# Patient Record
Sex: Female | Born: 1935 | Race: White | Hispanic: No | Marital: Married | Smoking: Never smoker
Health system: Southern US, Community
[De-identification: ages and names within clinical notes are randomized; demographics above are authoritative.]

## PROBLEM LIST (undated history)

## (undated) DIAGNOSIS — K219 Gastro-esophageal reflux disease without esophagitis: Secondary | ICD-10-CM

## (undated) DIAGNOSIS — I10 Essential (primary) hypertension: Secondary | ICD-10-CM

## (undated) DIAGNOSIS — M810 Age-related osteoporosis without current pathological fracture: Secondary | ICD-10-CM

## (undated) DIAGNOSIS — R413 Other amnesia: Secondary | ICD-10-CM

## (undated) DIAGNOSIS — E0590 Thyrotoxicosis, unspecified without thyrotoxic crisis or storm: Secondary | ICD-10-CM

## (undated) HISTORY — PX: ABDOMINAL HYSTERECTOMY: SHX81

## (undated) HISTORY — DX: Other amnesia: R41.3

## (undated) HISTORY — PX: TONSILLECTOMY: SUR1361

## (undated) HISTORY — PX: BACK SURGERY: SHX140

---

## 1997-05-18 ENCOUNTER — Ambulatory Visit (HOSPITAL_COMMUNITY): Admission: RE | Admit: 1997-05-18 | Discharge: 1997-05-18 | Payer: Self-pay | Admitting: Obstetrics and Gynecology

## 1997-05-23 ENCOUNTER — Other Ambulatory Visit: Admission: RE | Admit: 1997-05-23 | Discharge: 1997-05-23 | Payer: Self-pay | Admitting: Obstetrics and Gynecology

## 1997-12-06 ENCOUNTER — Emergency Department (HOSPITAL_COMMUNITY): Admission: EM | Admit: 1997-12-06 | Discharge: 1997-12-06 | Payer: Self-pay | Admitting: Emergency Medicine

## 1999-02-05 ENCOUNTER — Encounter: Admission: RE | Admit: 1999-02-05 | Discharge: 1999-02-05 | Payer: Self-pay | Admitting: *Deleted

## 1999-03-19 ENCOUNTER — Encounter: Payer: Self-pay | Admitting: Obstetrics and Gynecology

## 1999-03-19 ENCOUNTER — Encounter: Admission: RE | Admit: 1999-03-19 | Discharge: 1999-03-19 | Payer: Self-pay | Admitting: Obstetrics and Gynecology

## 1999-07-10 ENCOUNTER — Encounter: Admission: RE | Admit: 1999-07-10 | Discharge: 1999-07-10 | Payer: Self-pay | Admitting: *Deleted

## 1999-10-16 ENCOUNTER — Encounter: Admission: RE | Admit: 1999-10-16 | Discharge: 1999-10-16 | Payer: Self-pay | Admitting: *Deleted

## 2000-03-24 ENCOUNTER — Encounter: Admission: RE | Admit: 2000-03-24 | Discharge: 2000-03-24 | Payer: Self-pay | Admitting: Obstetrics and Gynecology

## 2000-03-24 ENCOUNTER — Encounter: Payer: Self-pay | Admitting: Obstetrics and Gynecology

## 2000-09-24 ENCOUNTER — Encounter: Payer: Self-pay | Admitting: Surgery

## 2000-09-26 ENCOUNTER — Ambulatory Visit (HOSPITAL_COMMUNITY): Admission: RE | Admit: 2000-09-26 | Discharge: 2000-09-26 | Payer: Self-pay | Admitting: Surgery

## 2001-02-13 ENCOUNTER — Other Ambulatory Visit: Admission: RE | Admit: 2001-02-13 | Discharge: 2001-02-13 | Payer: Self-pay | Admitting: Obstetrics and Gynecology

## 2001-02-16 ENCOUNTER — Encounter: Admission: RE | Admit: 2001-02-16 | Discharge: 2001-02-16 | Payer: Self-pay | Admitting: Obstetrics and Gynecology

## 2001-02-16 ENCOUNTER — Encounter: Payer: Self-pay | Admitting: Obstetrics and Gynecology

## 2001-03-26 ENCOUNTER — Encounter: Admission: RE | Admit: 2001-03-26 | Discharge: 2001-03-26 | Payer: Self-pay | Admitting: Obstetrics and Gynecology

## 2001-03-26 ENCOUNTER — Encounter: Payer: Self-pay | Admitting: Obstetrics and Gynecology

## 2002-03-29 ENCOUNTER — Encounter: Admission: RE | Admit: 2002-03-29 | Discharge: 2002-03-29 | Payer: Self-pay | Admitting: Obstetrics and Gynecology

## 2002-03-29 ENCOUNTER — Encounter: Payer: Self-pay | Admitting: Obstetrics and Gynecology

## 2002-12-30 ENCOUNTER — Encounter: Admission: RE | Admit: 2002-12-30 | Discharge: 2002-12-30 | Payer: Self-pay | Admitting: Internal Medicine

## 2003-02-21 ENCOUNTER — Encounter: Admission: RE | Admit: 2003-02-21 | Discharge: 2003-02-21 | Payer: Self-pay | Admitting: Orthopedic Surgery

## 2003-03-16 ENCOUNTER — Inpatient Hospital Stay (HOSPITAL_COMMUNITY): Admission: RE | Admit: 2003-03-16 | Discharge: 2003-03-17 | Payer: Self-pay | Admitting: Neurosurgery

## 2003-06-17 ENCOUNTER — Encounter: Admission: RE | Admit: 2003-06-17 | Discharge: 2003-06-17 | Payer: Self-pay | Admitting: Obstetrics and Gynecology

## 2003-10-05 ENCOUNTER — Encounter: Admission: RE | Admit: 2003-10-05 | Discharge: 2003-10-05 | Payer: Self-pay | Admitting: Obstetrics and Gynecology

## 2004-05-02 ENCOUNTER — Emergency Department (HOSPITAL_COMMUNITY): Admission: EM | Admit: 2004-05-02 | Discharge: 2004-05-02 | Payer: Self-pay | Admitting: Emergency Medicine

## 2004-08-22 ENCOUNTER — Encounter: Admission: RE | Admit: 2004-08-22 | Discharge: 2004-08-22 | Payer: Self-pay | Admitting: Obstetrics and Gynecology

## 2005-07-27 ENCOUNTER — Emergency Department (HOSPITAL_COMMUNITY): Admission: EM | Admit: 2005-07-27 | Discharge: 2005-07-27 | Payer: Self-pay | Admitting: Emergency Medicine

## 2005-09-10 ENCOUNTER — Ambulatory Visit: Payer: Self-pay | Admitting: Gastroenterology

## 2005-09-11 ENCOUNTER — Ambulatory Visit: Payer: Self-pay | Admitting: Gastroenterology

## 2005-09-30 ENCOUNTER — Encounter: Admission: RE | Admit: 2005-09-30 | Discharge: 2005-09-30 | Payer: Self-pay | Admitting: Obstetrics and Gynecology

## 2006-09-15 ENCOUNTER — Emergency Department (HOSPITAL_COMMUNITY): Admission: EM | Admit: 2006-09-15 | Discharge: 2006-09-15 | Payer: Self-pay | Admitting: Emergency Medicine

## 2006-10-06 ENCOUNTER — Encounter: Admission: RE | Admit: 2006-10-06 | Discharge: 2006-10-06 | Payer: Self-pay | Admitting: Obstetrics and Gynecology

## 2007-06-05 ENCOUNTER — Encounter: Admission: RE | Admit: 2007-06-05 | Discharge: 2007-06-05 | Payer: Self-pay | Admitting: Internal Medicine

## 2007-10-07 ENCOUNTER — Encounter: Admission: RE | Admit: 2007-10-07 | Discharge: 2007-10-07 | Payer: Self-pay | Admitting: Obstetrics and Gynecology

## 2008-10-07 ENCOUNTER — Encounter: Admission: RE | Admit: 2008-10-07 | Discharge: 2008-10-07 | Payer: Self-pay | Admitting: Obstetrics and Gynecology

## 2009-08-01 ENCOUNTER — Encounter: Admission: RE | Admit: 2009-08-01 | Discharge: 2009-08-01 | Payer: Self-pay | Admitting: Internal Medicine

## 2009-10-09 ENCOUNTER — Encounter: Admission: RE | Admit: 2009-10-09 | Discharge: 2009-10-09 | Payer: Self-pay | Admitting: Internal Medicine

## 2010-03-10 ENCOUNTER — Encounter: Payer: Self-pay | Admitting: Internal Medicine

## 2010-07-06 NOTE — Op Note (Signed)
[facility]  Patient:    Pamela Ibarra, Pamela Ibarra                      MRN: [id number] Proc. Date: 09/26/00 Adm. Date:  [id number] Attending:  Abigail Miyamoto A                           Operative Report  PREOPERATIVE DIAGNOSIS:  Hemorrhoids.  POSTOPERATIVE DIAGNOSIS:  Hemorrhoids.  PROCEDURE:  Hemorrhoidectomy.  SURGEON:  Abigail Miyamoto, M.D.  ANESTHESIA:  General endotracheal and 0.25% Marcaine with epinephrine.  ESTIMATED BLOOD LOSS:  Minimal.  DESCRIPTION OF PROCEDURE:  The patient was brought to the operating room and identified as [employer].  She was placed supine on the operating room table, and general anesthesia was induced.  Next, the patient was placed in the lithotomy position.  The perineum was then examined.  The Hill-Ferguson retractor was inserted into the perineum.  The patient was found to have one thrombosed external hemorrhoid which was incised with the clot being removed with a scalpel and a hemostat.  Next, the patients large hemorrhoidal column on the opposite side was identified and grasped with a Kelly clamp.  A stitch was then placed in the most internal aspect of the hemorrhoidal column.  This stitch was a 2-0 chromic suture.  The entire hemorrhoidal column was then excised with the electrocautery.  The defect in the mucosa was then closed with a running interlock 2-0 chromic suture.  Hemostasis appeared to be achieved.  The entire perineum was then anesthetized with 0.25% Marcaine with epinephrine.  A piece of Gelfoam was then inserted into the anal canal.  The patient tolerated the procedure well.  All sponge, needle, and instrument counts were correct at the end of the procedure.  The patient was then extubated in the operating room and taken in stable condition to the recovery room. DD:  09/26/00 TD:  09/27/00 Job: [id number]

## 2010-07-06 NOTE — Assessment & Plan Note (Signed)
[facility]                           GASTROENTEROLOGY OFFICE NOTE   NAME:YOUNGERShaneika, Rossa                      MRN:          [id number]  DATE:09/10/2005                            DOB:          09/13/1928    GI CONSULT:  Ms. Niese is a 77 year old white female referred by Dr.  Wylene Simmer for evaluation of epigastric abdominal pain.   Ms. Benham has had years of acid reflux treated with prescription Pepcid.  Apparently over the last year or two her problems with acid reflux went away  only to return over the last year with rather typical burning, some sternal  pain and subxiphoid pain radiating to her back without associated dysphagia.  She has been on and off Aciphex and Prevacid 1-2 times a day with mild  improvement but continues to have problems.  She presented to the emergency  room apparently on July 27, 2005, with severe pain and had a negative  ultrasound exam.  I do not have records of that visit this time.  She denies  any specific episode biliary complaints such as clay colored stools, dark  urine, icterus, fever or chills.  She denies any dysphagia, and her weight  is stable.  She has regular bowel movements without melena or hematochezia  and relates her recent hemoccult cards in November were negative.  I have  previously seen this patient and done colonoscopy in July, 2002, because of  recurrent rectal bleeding, and she had hemorrhoidectomy by Dr. Abigail Miyamoto.   All of the patient's upper GI problems seem to have exacerbated since she  has been placed on Actonel 35 mg once a week.   OTHER MEDICATIONS:  1.  Synthroid 150 mcg a day.  2.  Ziac 2.5-6.25 mg a day.  3.  Amlodipine 5 mg a day.  4.  Colace daily.  5.  Multivitamins daily.  6.  Viactiv 500 daily.  7.  Prevacid 30 mg a day.  8.  She uses p.r.n. Tylenol, Valium, and Dulcolax.   SHE DENIES DRUG ALLERGIES.   FAMILY HISTORY:  Noncontributory.   PAST MEDICAL  HISTORY:  1.  The patient does suffer from essential hypertension, chronic thyroid      dysfunction.  2.  She has had a total abdominal hysterectomy and bilateral oophorectomy in      1999.  3.  As mentioned above, she had a hemorrhoidectomy by Dr. Magnus Ivan.   SOCIAL HISTORY:  The patient is married and lives with her husband.  She is  a retired Engineer, civil (consulting) from [employer].  She does not smoke or use  ethanol.   REVIEW OF SYSTEMS:  Otherwise noncontributory.  She has no symptoms of  Raynaud's phenomenon or other symptoms of collagen vascular disease.  She  denies any current cardiovascular, pulmonary, neurological, genitourinary,  or psychiatric problems.  She does have osteopenia for which she takes  Actonel.   EXAM:  Shows her to be a healthy appearing white female who appears stated  age.  She is 5 feet 5 inches tall and weighs 130 pounds.  Blood pressure is  120/74 and pulse was 78 and regular.  I could not appreciate a stigmata of  chronic liver disease or thyromegaly.  CHEST:  Clear to percussion and auscultation.  I could not appreciate  murmurs, gallops or rubs and she appeared to be in a regular rhythm.  ABDOMEN:  Showed no organomegaly, masses or tenderness.  There is a well  healed suprapubic scar noted.  __________ EXTREMITIES:  Unremarkable.  RECTAL EXAM:  Deferred at this time.   ASSESSMENT:  1.  Acid reflux with probable hiatal hernia.  Her pain is somewhat unusual      and atypical because of the severity of the pain and recurrent nature of      the pain.  I suspect this is related to Actonel use.  2.  History of constipation, predominantly irritable bowel syndrome and      hemorrhoidal bleeding with a previous hemorrhoidectomy.  3.  Status post total abdominal hysterectomy and bilateral oophorectomy.  4.  History of chronic thyroid dysfunction.  5.  History of well controlled essential hypertension.   RECOMMENDATIONS:  1.  Outpatient endoscopic exam at her  convenience.  2.  Restart Prevacid 30 mg twice a day 30 minutes before breakfast and      supper.  3.  Movie on acid reflux and its management.  4.  Levsin use p.r.n.  5.  Other medications as per Dr. Wylene Simmer.                                   Vania Rea. Jarold Motto, MD, Clementeen Graham, [place]   DRP/MedQ  DD:  09/10/2005  DT:  09/10/2005  Job #:  [id number]   cc:   Gaspar Garbe, MD  Abigail Miyamoto, MD

## 2010-07-06 NOTE — Op Note (Signed)
Pamela Ibarra, Pamela Ibarra                         ACCOUNT NO.:  [id number]   MEDICAL RECORD NO.:  [id number]                   PATIENT TYPE:  INP   LOCATION:  2899                                 FACILITY:  [facility]   PHYSICIAN:  Hewitt Shorts, M.D.            DATE OF BIRTH:  1928/12/04   DATE OF PROCEDURE:  03/16/2003  DATE OF DISCHARGE:                                 OPERATIVE REPORT   PREOPERATIVE DIAGNOSIS:  Bilateral L4-L5 synovial cyst, left larger than  right, lumbar spinal stenosis, lumbar degenerative disc disease, and lumbar  radiculopathy.   POSTOPERATIVE DIAGNOSIS:  Bilateral L4-L5 synovial cyst, left larger than  right, lumbar spinal stenosis, lumbar degenerative disc disease, and lumbar  radiculopathy.   PROCEDURE:  Bilateral L4-L5 lumbar laminotomies and resection of bilateral  synovial cyst.   SURGEON:  Hewitt Shorts, M.D.   ASSISTANT:  Payton Doughty, M.D.   ANESTHESIA:  General endotracheal anesthesia.   INDICATIONS FOR PROCEDURE:  This 77 year old woman who presented with lumbar  radiculopathy was found by MRI scan to have bilateral L4-L5 synovial cyst  with the left side much larger than the right side.  There was evidence of  facet arthropathy, however, flexion extension MR spine x-rays showed minimal  listhesis L4 and L5 that did not change through flexion extension indicating  a static dynamic listhesis.  The decision was made to proceed with bilateral  laminotomies and resection of the synovial cysts with microdissection.   PROCEDURE:  The patient was brought to the operating room and placed under  general endotracheal anesthesia.  The patient was turned to the prone  position and the lumbar region was prepped with DuraPrep and draped in a  sterile fashion.  The midline was infiltrated with local anesthetic with  epinephrine.  An x-ray was taken, the L4-L5 level was identified.  A midline  incision was made over the L4-L5 level and carried down  through the  subcutaneous tissue, bipolar cautery and electrocautery was used to maintain  hemostasis.  Dissection was carried down to the lumbar fascia which was  incised bilaterally and the paraspinal muscles were dissected from the  spinous process and lamina in a subperiosteal fashion.  Self-retaining  retractor was placed and an x-ray taken and the L4-L5 interlaminar space was  identified.  Bilateral laminotomies were performed using the [facility] Max drill  and Kerrison punches.  The microscope was draped and brought into the field  to provide identification, illumination, and visualization, and the  remainder of the decompression was performed using microdissection and  microsurgical technique.  We carefully removed the ligamentum flavum and the  small right L4-L5 synovial cyst was identified and easily removed.  However,  on the left side, the synovial cyst was much larger, it had numerous  adhesions to the thecal sac, these were carefully dissected using  microdissection and microsurgical technique.  However, portions of the cyst  wall and  ligamentum flavum were stuck down to the dura and, therefore, the  cyst was removed in a piecemeal fashion.  The cyst was entered.  There was a  hemorrhagic portion within it, this was evacuated.  There was also some  xanthomatous material that was, likewise, removed.  We then carefully  removed the cyst wall, although as noted above, there were certain spots  where it was densely adherent to the dura and it was felt that to remove  these would almost certainly result in disruption of the thecal sac.  However, we were able to achieve good decompression of the thecal sac and  nerve roots and it was felt that good decompression had been achieved  bilaterally.  The wound was irrigated on numerous occasions throughout the  case with Bacitracin solution.  Hemostasis was established and confirmed.  Once the decompression was completed and hemostasis  established, we  proceeded with closure.  The deep fascia was closed with interrupted undyed  #1 Vicryl suture, the subcutaneous and subcuticular layer was closed with  interrupted inverted 2-0 undyed Vicryl sutures and 3-0 undyed Vicryl  sutures, and the skin was reapproximated with Dermabond.  The patient  tolerated the procedure well.  Estimated blood loss was 50 mL.  Sponge  counts were correct.  Following surgery, the patient was turned back to the  supine position, reversed from the anesthetic, extubated, and transferred to  the recovery room for further care.                                               Hewitt Shorts, M.D.    RWN/MEDQ  D:  03/16/2003  T:  03/16/2003  Job:  [id number]

## 2010-09-14 ENCOUNTER — Other Ambulatory Visit: Payer: Self-pay | Admitting: Internal Medicine

## 2010-09-14 DIAGNOSIS — Z1231 Encounter for screening mammogram for malignant neoplasm of breast: Secondary | ICD-10-CM

## 2010-09-14 DIAGNOSIS — M8580 Other specified disorders of bone density and structure, unspecified site: Secondary | ICD-10-CM

## 2010-10-17 ENCOUNTER — Ambulatory Visit
Admission: RE | Admit: 2010-10-17 | Discharge: 2010-10-17 | Disposition: A | Discharge status: Home / Self Care | Payer: Private Health Insurance - Indemnity | Source: Ambulatory Visit | Attending: Internal Medicine | Admitting: Internal Medicine

## 2010-10-17 DIAGNOSIS — M8580 Other specified disorders of bone density and structure, unspecified site: Secondary | ICD-10-CM

## 2010-10-17 DIAGNOSIS — Z1231 Encounter for screening mammogram for malignant neoplasm of breast: Secondary | ICD-10-CM

## 2010-12-03 LAB — BASIC METABOLIC PANEL
Chloride: 92 — ABNORMAL LOW
GFR calc Af Amer: >60
GFR calc non Af Amer: 57 — ABNORMAL LOW

## 2010-12-03 LAB — CBC
HCT: 43
Hemoglobin: 14.5
Platelets: 322
RBC: 4.67
WBC: 10.5

## 2010-12-03 LAB — POCT CARDIAC MARKERS: Operator id: 294511

## 2011-04-04 DIAGNOSIS — H40019 Open angle with borderline findings, low risk, unspecified eye: Secondary | ICD-10-CM | POA: Diagnosis not present

## 2011-04-04 DIAGNOSIS — H2510 Age-related nuclear cataract, unspecified eye: Secondary | ICD-10-CM | POA: Diagnosis not present

## 2011-05-25 ENCOUNTER — Emergency Department (HOSPITAL_COMMUNITY): Payer: Medicare Other

## 2011-05-25 ENCOUNTER — Encounter (HOSPITAL_COMMUNITY): Payer: Self-pay | Admitting: Nurse Practitioner

## 2011-05-25 ENCOUNTER — Emergency Department (HOSPITAL_COMMUNITY)
Admission: EM | Admit: 2011-05-25 | Discharge: 2011-05-25 | Disposition: A | Discharge status: Home / Self Care | Payer: Medicare Other | Attending: Emergency Medicine | Admitting: Emergency Medicine

## 2011-05-25 ENCOUNTER — Other Ambulatory Visit: Payer: Self-pay

## 2011-05-25 DIAGNOSIS — R079 Chest pain, unspecified: Secondary | ICD-10-CM | POA: Diagnosis not present

## 2011-05-25 DIAGNOSIS — R109 Unspecified abdominal pain: Secondary | ICD-10-CM | POA: Diagnosis not present

## 2011-05-25 DIAGNOSIS — I10 Essential (primary) hypertension: Secondary | ICD-10-CM | POA: Insufficient documentation

## 2011-05-25 DIAGNOSIS — I700 Atherosclerosis of aorta: Secondary | ICD-10-CM | POA: Diagnosis not present

## 2011-05-25 DIAGNOSIS — Z8249 Family history of ischemic heart disease and other diseases of the circulatory system: Secondary | ICD-10-CM | POA: Diagnosis not present

## 2011-05-25 DIAGNOSIS — R1013 Epigastric pain: Secondary | ICD-10-CM | POA: Insufficient documentation

## 2011-05-25 DIAGNOSIS — Z79899 Other long term (current) drug therapy: Secondary | ICD-10-CM | POA: Diagnosis not present

## 2011-05-25 HISTORY — DX: Essential (primary) hypertension: I10

## 2011-05-25 HISTORY — DX: Gastro-esophageal reflux disease without esophagitis: K21.9

## 2011-05-25 LAB — CBC
HCT: 39.8 % (ref 36.0–46.0)
Hemoglobin: 14.1 g/dL (ref 12.0–15.0)
MCH: 31.1 pg (ref 26.0–34.0)
MCHC: 35.4 g/dL (ref 30.0–36.0)
RBC: 4.54 MIL/uL (ref 3.87–5.11)

## 2011-05-25 LAB — POCT I-STAT TROPONIN I
Troponin i, poc: 0 ng/mL (ref 0.00–0.08)
Troponin i, poc: 0.01 ng/mL (ref 0.00–0.08)
Troponin i, poc: 0.01 ng/mL (ref 0.00–0.08)

## 2011-05-25 LAB — COMPREHENSIVE METABOLIC PANEL
Alkaline Phosphatase: 85 U/L (ref 39–117)
BUN: 18 mg/dL (ref 6–23)
CO2: 30 mEq/L (ref 19–32)
GFR calc Af Amer: 74 mL/min — ABNORMAL LOW (ref >90)
GFR calc non Af Amer: 64 mL/min — ABNORMAL LOW (ref >90)
Glucose, Bld: 108 mg/dL — ABNORMAL HIGH (ref 70–99)
Potassium: 4.3 mEq/L (ref 3.5–5.1)
Total Protein: 7.2 g/dL (ref 6.0–8.3)

## 2011-05-25 NOTE — ED Notes (Signed)
C/o "beating in my abdomen and ears" and pain from abdomen into back over past week. Reports she has similar symptoms last year and dr Wylene Simmer tested her for an aneurysm but test was negative. A&Ox4, resp e/u

## 2011-05-25 NOTE — ED Provider Notes (Signed)
The patient has remained pain free for duration of ED visit. Second troponin negative. She can be discharged home to follow up with your doctor this week for recheck.   Rodena Medin, PA-C 05/25/11 2037

## 2011-05-25 NOTE — Discharge Instructions (Signed)
FOLLOW UP WITH YOUR DOCTOR FOR RECHECK THIS WEEK. RECOMMEND RE-STARTING YOUR REFLUX MEDICATION. RETURN HERE WITH ANY WORSENING PAIN OR NEW CONCERNS.  Abdominal Pain Abdominal pain can be caused by many things. Your caregiver decides the seriousness of your pain by an examination and possibly blood tests and X-rays. Many cases can be observed and treated at home. Most abdominal pain is not caused by a disease and will probably improve without treatment. However, in many cases, more time must pass before a clear cause of the pain can be found. Before that point, it may not be known if you need more testing, or if hospitalization or surgery is needed. HOME CARE INSTRUCTIONS   Do not take laxatives unless directed by your caregiver.   Take pain medicine only as directed by your caregiver.   Only take over-the-counter or prescription medicines for pain, discomfort, or fever as directed by your caregiver.   Try a clear liquid diet (broth, tea, or water) for as long as directed by your caregiver. Slowly move to a bland diet as tolerated.  SEEK IMMEDIATE MEDICAL CARE IF:   The pain does not go away.   You have a fever.   You keep throwing up (vomiting).   The pain is felt only in portions of the abdomen. Pain in the right side could possibly be appendicitis. In an adult, pain in the left lower portion of the abdomen could be colitis or diverticulitis.   You pass bloody or black tarry stools.  MAKE SURE YOU:   Understand these instructions.   Will watch your condition.   Will get help right away if you are not doing well or get worse.  Document Released: 11/14/2004 Document Revised: 01/24/2011 Document Reviewed: 09/23/2007 [facility] Patient Information 2012 [place], [place].

## 2011-05-25 NOTE — ED Provider Notes (Signed)
History     CSN: [id number]  Arrival date & time 05/25/11  1337   First MD Initiated Contact with Patient 05/25/11 1559      Chief Complaint  Patient presents with  . Abdominal Pain    (Consider location/radiation/quality/duration/timing/severity/associated sxs/prior treatment) HPI Patient complains of a sensation of her heart beating in her abdomen for the past one or 2 years. Patient also reports epigastric pain radiating to the scapular area intermittent for the past 3-4 weeks sometimes worse with changing position not worse with exertion no associated shortness of breath nausea or sweatiness. Patient presently asymptomatic. No treatment prior to coming here. No lightheadedness no nausea or vomiting last bowel movement yesterday.  Past Medical History  Diagnosis Date  . Hypertension     Past Surgical History  Procedure Date  . Abdominal hysterectomy   . Back surgery     History reviewed. No pertinent family history.  History  Substance Use Topics  . Smoking status: Never Smoker   . Smokeless tobacco: Not on file  . Alcohol Use: No    OB History    Grav Para Term Preterm Abortions TAB SAB Ect Mult Living                  Review of Systems  Constitutional: Negative.   HENT: Negative.   Respiratory: Negative.   Cardiovascular: Negative.   Gastrointestinal: Positive for abdominal pain.  Musculoskeletal: Negative.   Skin: Negative.   Neurological: Negative.   Hematological: Negative.   Psychiatric/Behavioral: Negative.   All other systems reviewed and are negative.    Allergies  Lidocaine and Morphine and related  Home Medications   Current Outpatient Rx  Name Route Sig Dispense Refill  . ACETAMINOPHEN 325 MG PO TABS Oral Take 325-650 mg by mouth every 4 (four) hours as needed. For pain.    Marland Kitchen AMLODIPINE BESYLATE 10 MG PO TABS Oral Take 10 mg by mouth every evening.    Marland Kitchen BISOPROLOL-HYDROCHLOROTHIAZIDE 2.5-6.25 MG PO TABS Oral Take 1 tablet by mouth every  morning.    Marland Kitchen DOCUSATE SODIUM 100 MG PO CAPS Oral Take 100 mg by mouth daily.    Marland Kitchen LEVOTHYROXINE SODIUM 100 MCG PO TABS Oral Take 50 mcg by mouth every morning.    . ADULT MULTIVITAMIN W/MINERALS CH Oral Take 1 tablet by mouth every morning.    Marland Kitchen SIMVASTATIN 20 MG PO TABS Oral Take 20 mg by mouth every evening.      BP 127/72  Pulse 69  Temp(Src) 97.9 F (36.6 C) (Oral)  Resp 20  Ht 5\' 5"  (1.651 m)  Wt 118 lb (53.524 kg)  BMI 19.64 kg/m2  SpO2 98%  Physical Exam  Constitutional: She appears well-developed and well-nourished.  HENT:  Head: Normocephalic and atraumatic.  Eyes: Conjunctivae are normal. Pupils are equal, round, and reactive to light.  Neck: Neck supple. No tracheal deviation present. No thyromegaly present.  Cardiovascular: Normal rate and regular rhythm.   No murmur heard. Pulmonary/Chest: Effort normal and breath sounds normal.  Abdominal: Soft. Bowel sounds are normal. She exhibits no distension and no mass. There is tenderness.       Minimal tenderness at epigastrium   Musculoskeletal: Normal range of motion. She exhibits no edema and no tenderness.  Neurological: She is alert. Coordination normal.  Skin: Skin is warm and dry. No rash noted.  Psychiatric: She has a normal mood and affect.    Date: 05/25/2011  Rate: 70  Rhythm: normal sinus rhythm  QRS Axis: normal  Intervals: normal  ST/T Wave abnormalities: nonspecific T wave changes  Conduction Disutrbances:none  Narrative Interpretation:   Old EKG Reviewed: changes noted PVCs from 09/15/2006 have resolved  ED Course  Procedures (including critical care time)  Labs Reviewed - No data to display No results found.   No diagnosis found.    MDM  Will check ultrasound for abdominal aortic aneurysm, serial cardiac enzymes. Move to CDU. Assessment symptoms highly atypical for ACS         Doug Sou, MD 05/25/11 1643

## 2011-05-25 NOTE — ED Notes (Signed)
Patrient is AOx4 and comfortable with her discharge instructions.

## 2011-05-26 NOTE — ED Provider Notes (Signed)
Medical screening examination/treatment/procedure(s) were performed by non-physician practitioner and as supervising physician I was immediately available for consultation/collaboration.  Abdifatah Colquhoun, MD 05/26/11 0212 

## 2011-05-29 DIAGNOSIS — I10 Essential (primary) hypertension: Secondary | ICD-10-CM | POA: Diagnosis not present

## 2011-05-29 DIAGNOSIS — R0602 Shortness of breath: Secondary | ICD-10-CM | POA: Diagnosis not present

## 2011-05-29 DIAGNOSIS — F411 Generalized anxiety disorder: Secondary | ICD-10-CM | POA: Diagnosis not present

## 2011-07-29 DIAGNOSIS — I10 Essential (primary) hypertension: Secondary | ICD-10-CM | POA: Diagnosis not present

## 2011-07-29 DIAGNOSIS — R82998 Other abnormal findings in urine: Secondary | ICD-10-CM | POA: Diagnosis not present

## 2011-07-29 DIAGNOSIS — R7301 Impaired fasting glucose: Secondary | ICD-10-CM | POA: Diagnosis not present

## 2011-07-29 DIAGNOSIS — E785 Hyperlipidemia, unspecified: Secondary | ICD-10-CM | POA: Diagnosis not present

## 2011-07-29 DIAGNOSIS — E039 Hypothyroidism, unspecified: Secondary | ICD-10-CM | POA: Diagnosis not present

## 2011-07-29 DIAGNOSIS — M949 Disorder of cartilage, unspecified: Secondary | ICD-10-CM | POA: Diagnosis not present

## 2011-07-29 DIAGNOSIS — M899 Disorder of bone, unspecified: Secondary | ICD-10-CM | POA: Diagnosis not present

## 2011-08-05 DIAGNOSIS — E039 Hypothyroidism, unspecified: Secondary | ICD-10-CM | POA: Diagnosis not present

## 2011-08-05 DIAGNOSIS — Z0000 Encounter for general adult medical examination without abnormal findings: Secondary | ICD-10-CM | POA: Diagnosis not present

## 2011-08-05 DIAGNOSIS — I10 Essential (primary) hypertension: Secondary | ICD-10-CM | POA: Diagnosis not present

## 2011-08-05 DIAGNOSIS — E785 Hyperlipidemia, unspecified: Secondary | ICD-10-CM | POA: Diagnosis not present

## 2011-08-06 DIAGNOSIS — Z1212 Encounter for screening for malignant neoplasm of rectum: Secondary | ICD-10-CM | POA: Diagnosis not present

## 2011-11-05 ENCOUNTER — Other Ambulatory Visit: Payer: Self-pay | Admitting: Internal Medicine

## 2011-11-05 DIAGNOSIS — Z1231 Encounter for screening mammogram for malignant neoplasm of breast: Secondary | ICD-10-CM

## 2011-11-18 ENCOUNTER — Ambulatory Visit
Admission: RE | Admit: 2011-11-18 | Discharge: 2011-11-18 | Disposition: A | Discharge status: Home / Self Care | Payer: Medicare Other | Source: Ambulatory Visit | Attending: Internal Medicine | Admitting: Internal Medicine

## 2011-11-18 DIAGNOSIS — Z1231 Encounter for screening mammogram for malignant neoplasm of breast: Secondary | ICD-10-CM | POA: Diagnosis not present

## 2011-11-26 DIAGNOSIS — Z23 Encounter for immunization: Secondary | ICD-10-CM | POA: Diagnosis not present

## 2012-02-05 DIAGNOSIS — I10 Essential (primary) hypertension: Secondary | ICD-10-CM | POA: Diagnosis not present

## 2012-02-05 DIAGNOSIS — K219 Gastro-esophageal reflux disease without esophagitis: Secondary | ICD-10-CM | POA: Diagnosis not present

## 2012-02-05 DIAGNOSIS — M899 Disorder of bone, unspecified: Secondary | ICD-10-CM | POA: Diagnosis not present

## 2012-02-05 DIAGNOSIS — M1990 Unspecified osteoarthritis, unspecified site: Secondary | ICD-10-CM | POA: Diagnosis not present

## 2012-02-05 DIAGNOSIS — Z1331 Encounter for screening for depression: Secondary | ICD-10-CM | POA: Diagnosis not present

## 2012-02-05 DIAGNOSIS — E785 Hyperlipidemia, unspecified: Secondary | ICD-10-CM | POA: Diagnosis not present

## 2012-02-05 DIAGNOSIS — E039 Hypothyroidism, unspecified: Secondary | ICD-10-CM | POA: Diagnosis not present

## 2012-02-05 DIAGNOSIS — R7301 Impaired fasting glucose: Secondary | ICD-10-CM | POA: Diagnosis not present

## 2012-04-16 DIAGNOSIS — H40019 Open angle with borderline findings, low risk, unspecified eye: Secondary | ICD-10-CM | POA: Diagnosis not present

## 2012-04-16 DIAGNOSIS — H2510 Age-related nuclear cataract, unspecified eye: Secondary | ICD-10-CM | POA: Diagnosis not present

## 2012-08-04 DIAGNOSIS — M949 Disorder of cartilage, unspecified: Secondary | ICD-10-CM | POA: Diagnosis not present

## 2012-08-04 DIAGNOSIS — E039 Hypothyroidism, unspecified: Secondary | ICD-10-CM | POA: Diagnosis not present

## 2012-08-04 DIAGNOSIS — R82998 Other abnormal findings in urine: Secondary | ICD-10-CM | POA: Diagnosis not present

## 2012-08-04 DIAGNOSIS — E785 Hyperlipidemia, unspecified: Secondary | ICD-10-CM | POA: Diagnosis not present

## 2012-08-04 DIAGNOSIS — I10 Essential (primary) hypertension: Secondary | ICD-10-CM | POA: Diagnosis not present

## 2012-08-04 DIAGNOSIS — M899 Disorder of bone, unspecified: Secondary | ICD-10-CM | POA: Diagnosis not present

## 2012-08-04 DIAGNOSIS — R7301 Impaired fasting glucose: Secondary | ICD-10-CM | POA: Diagnosis not present

## 2012-08-10 DIAGNOSIS — M949 Disorder of cartilage, unspecified: Secondary | ICD-10-CM | POA: Diagnosis not present

## 2012-08-10 DIAGNOSIS — M899 Disorder of bone, unspecified: Secondary | ICD-10-CM | POA: Diagnosis not present

## 2012-08-10 DIAGNOSIS — I10 Essential (primary) hypertension: Secondary | ICD-10-CM | POA: Diagnosis not present

## 2012-08-10 DIAGNOSIS — Z1212 Encounter for screening for malignant neoplasm of rectum: Secondary | ICD-10-CM | POA: Diagnosis not present

## 2012-08-10 DIAGNOSIS — F411 Generalized anxiety disorder: Secondary | ICD-10-CM | POA: Diagnosis not present

## 2012-08-10 DIAGNOSIS — K219 Gastro-esophageal reflux disease without esophagitis: Secondary | ICD-10-CM | POA: Diagnosis not present

## 2012-08-10 DIAGNOSIS — E039 Hypothyroidism, unspecified: Secondary | ICD-10-CM | POA: Diagnosis not present

## 2012-08-10 DIAGNOSIS — IMO0002 Reserved for concepts with insufficient information to code with codable children: Secondary | ICD-10-CM | POA: Diagnosis not present

## 2012-08-10 DIAGNOSIS — E785 Hyperlipidemia, unspecified: Secondary | ICD-10-CM | POA: Diagnosis not present

## 2012-08-10 DIAGNOSIS — Z0000 Encounter for general adult medical examination without abnormal findings: Secondary | ICD-10-CM | POA: Diagnosis not present

## 2012-10-20 ENCOUNTER — Other Ambulatory Visit: Payer: Self-pay

## 2012-10-20 DIAGNOSIS — Z1231 Encounter for screening mammogram for malignant neoplasm of breast: Secondary | ICD-10-CM

## 2012-11-03 ENCOUNTER — Other Ambulatory Visit: Payer: Self-pay | Admitting: Internal Medicine

## 2012-11-03 DIAGNOSIS — M8580 Other specified disorders of bone density and structure, unspecified site: Secondary | ICD-10-CM

## 2012-11-12 DIAGNOSIS — Z23 Encounter for immunization: Secondary | ICD-10-CM | POA: Diagnosis not present

## 2012-11-19 ENCOUNTER — Ambulatory Visit
Admission: RE | Admit: 2012-11-19 | Discharge: 2012-11-19 | Disposition: A | Discharge status: Home / Self Care | Payer: Medicare Other | Source: Ambulatory Visit

## 2012-11-19 DIAGNOSIS — Z1231 Encounter for screening mammogram for malignant neoplasm of breast: Secondary | ICD-10-CM | POA: Diagnosis not present

## 2012-12-17 ENCOUNTER — Ambulatory Visit
Admission: RE | Admit: 2012-12-17 | Discharge: 2012-12-17 | Disposition: A | Discharge status: Home / Self Care | Payer: Medicare Other | Source: Ambulatory Visit | Attending: Internal Medicine | Admitting: Internal Medicine

## 2012-12-17 DIAGNOSIS — M899 Disorder of bone, unspecified: Secondary | ICD-10-CM | POA: Diagnosis not present

## 2012-12-17 DIAGNOSIS — M8580 Other specified disorders of bone density and structure, unspecified site: Secondary | ICD-10-CM

## 2013-02-01 DIAGNOSIS — E785 Hyperlipidemia, unspecified: Secondary | ICD-10-CM | POA: Diagnosis not present

## 2013-02-01 DIAGNOSIS — E039 Hypothyroidism, unspecified: Secondary | ICD-10-CM | POA: Diagnosis not present

## 2013-02-01 DIAGNOSIS — M899 Disorder of bone, unspecified: Secondary | ICD-10-CM | POA: Diagnosis not present

## 2013-02-01 DIAGNOSIS — F411 Generalized anxiety disorder: Secondary | ICD-10-CM | POA: Diagnosis not present

## 2013-02-01 DIAGNOSIS — M1990 Unspecified osteoarthritis, unspecified site: Secondary | ICD-10-CM | POA: Diagnosis not present

## 2013-02-01 DIAGNOSIS — K219 Gastro-esophageal reflux disease without esophagitis: Secondary | ICD-10-CM | POA: Diagnosis not present

## 2013-02-01 DIAGNOSIS — R7301 Impaired fasting glucose: Secondary | ICD-10-CM | POA: Diagnosis not present

## 2013-02-01 DIAGNOSIS — I10 Essential (primary) hypertension: Secondary | ICD-10-CM | POA: Diagnosis not present

## 2013-02-01 DIAGNOSIS — Z23 Encounter for immunization: Secondary | ICD-10-CM | POA: Diagnosis not present

## 2013-04-19 DIAGNOSIS — H2510 Age-related nuclear cataract, unspecified eye: Secondary | ICD-10-CM | POA: Diagnosis not present

## 2013-04-19 DIAGNOSIS — H40019 Open angle with borderline findings, low risk, unspecified eye: Secondary | ICD-10-CM | POA: Diagnosis not present

## 2013-08-10 DIAGNOSIS — M899 Disorder of bone, unspecified: Secondary | ICD-10-CM | POA: Diagnosis not present

## 2013-08-10 DIAGNOSIS — E785 Hyperlipidemia, unspecified: Secondary | ICD-10-CM | POA: Diagnosis not present

## 2013-08-10 DIAGNOSIS — R7301 Impaired fasting glucose: Secondary | ICD-10-CM | POA: Diagnosis not present

## 2013-08-10 DIAGNOSIS — I10 Essential (primary) hypertension: Secondary | ICD-10-CM | POA: Diagnosis not present

## 2013-08-10 DIAGNOSIS — E039 Hypothyroidism, unspecified: Secondary | ICD-10-CM | POA: Diagnosis not present

## 2013-08-13 DIAGNOSIS — Z1212 Encounter for screening for malignant neoplasm of rectum: Secondary | ICD-10-CM | POA: Diagnosis not present

## 2013-08-24 DIAGNOSIS — E039 Hypothyroidism, unspecified: Secondary | ICD-10-CM | POA: Diagnosis not present

## 2013-08-24 DIAGNOSIS — R7301 Impaired fasting glucose: Secondary | ICD-10-CM | POA: Diagnosis not present

## 2013-08-24 DIAGNOSIS — E785 Hyperlipidemia, unspecified: Secondary | ICD-10-CM | POA: Diagnosis not present

## 2013-08-24 DIAGNOSIS — Z1331 Encounter for screening for depression: Secondary | ICD-10-CM | POA: Diagnosis not present

## 2013-08-24 DIAGNOSIS — M949 Disorder of cartilage, unspecified: Secondary | ICD-10-CM | POA: Diagnosis not present

## 2013-08-24 DIAGNOSIS — M899 Disorder of bone, unspecified: Secondary | ICD-10-CM | POA: Diagnosis not present

## 2013-08-24 DIAGNOSIS — Z0000 Encounter for general adult medical examination without abnormal findings: Secondary | ICD-10-CM | POA: Diagnosis not present

## 2013-08-24 DIAGNOSIS — K219 Gastro-esophageal reflux disease without esophagitis: Secondary | ICD-10-CM | POA: Diagnosis not present

## 2013-08-24 DIAGNOSIS — I10 Essential (primary) hypertension: Secondary | ICD-10-CM | POA: Diagnosis not present

## 2013-09-17 DIAGNOSIS — S8010XA Contusion of unspecified lower leg, initial encounter: Secondary | ICD-10-CM | POA: Diagnosis not present

## 2013-10-01 DIAGNOSIS — L570 Actinic keratosis: Secondary | ICD-10-CM | POA: Diagnosis not present

## 2013-11-17 DIAGNOSIS — Z23 Encounter for immunization: Secondary | ICD-10-CM | POA: Diagnosis not present

## 2014-09-07 ENCOUNTER — Encounter (HOSPITAL_COMMUNITY): Payer: Self-pay | Admitting: *Deleted

## 2014-09-07 ENCOUNTER — Emergency Department (HOSPITAL_COMMUNITY)
Admission: EM | Admit: 2014-09-07 | Discharge: 2014-09-08 | Disposition: A | Discharge status: Home / Self Care | Payer: Medicare HMO | Attending: Emergency Medicine | Admitting: Emergency Medicine

## 2014-09-07 DIAGNOSIS — R42 Dizziness and giddiness: Secondary | ICD-10-CM | POA: Insufficient documentation

## 2014-09-07 DIAGNOSIS — Z79899 Other long term (current) drug therapy: Secondary | ICD-10-CM | POA: Diagnosis not present

## 2014-09-07 DIAGNOSIS — H938X1 Other specified disorders of right ear: Secondary | ICD-10-CM

## 2014-09-07 DIAGNOSIS — Z8719 Personal history of other diseases of the digestive system: Secondary | ICD-10-CM | POA: Diagnosis not present

## 2014-09-07 DIAGNOSIS — I10 Essential (primary) hypertension: Secondary | ICD-10-CM | POA: Insufficient documentation

## 2014-09-07 NOTE — ED Notes (Addendum)
Pt complains of "popping" and difficulty hearing in her right ear since this afternoon. Pt states she has also felt dizziness for the past 2-3 days. Pt was being treated for urinary tract infection for the past week, pt is unsure what the antibiotic was called.

## 2014-09-07 NOTE — ED Provider Notes (Signed)
CSN: [id number]     Arrival date & time 09/07/14  2211 History  This chart was scribed for Debby Freiberg, MD by Randa Evens, ED Scribe. This patient was seen in room WA04/WA04 and the patient's care was started at 11:44 PM.     Chief Complaint  Patient presents with  . Dizziness  . Right ear popping    Patient is a 81 y.o. female presenting with plugged ear sensation. The history is provided by the patient. No language interpreter was used.  Ear Fullness This is a new problem. The current episode started 12 to 24 hours ago. The problem occurs rarely. The problem has been gradually improving. Nothing aggravates the symptoms. Nothing relieves the symptoms. She has tried nothing for the symptoms.    HPI Comments: Pamela Ibarra is a 81 y.o. female who presents to the Emergency Department complaining of improving decreased hearing in the right ear onset today. She states that she is having intermittent "ear popping." She doesn't report any medications PTA. Pt does report recently seeing her PCP 4 days ago and received an antibiotic for possible UTI. Pt doesn't cough, congestion, sore throat, numbness weakness. Pt does report some chronic back pain but states that she has a Hx back surgery.    Past Medical History  Diagnosis Date  . Hypertension   . GERD (gastroesophageal reflux disease)    Past Surgical History  Procedure Laterality Date  . Abdominal hysterectomy    . Back surgery     No family history on file. History  Substance Use Topics  . Smoking status: Never Smoker   . Smokeless tobacco: Not on file  . Alcohol Use: No   OB History    No data available     Review of Systems  All other systems reviewed and are negative.    Allergies  Lidocaine and Morphine and related  Home Medications   Prior to Admission medications   Medication Sig Start Date End Date Taking? Authorizing Provider  acetaminophen (TYLENOL) 325 MG tablet Take 325-650 mg by mouth every 4 (four)  hours as needed. For pain.   Yes Historical Provider, MD  amLODipine (NORVASC) 10 MG tablet Take 10 mg by mouth every evening.   Yes Historical Provider, MD  bisoprolol-hydrochlorothiazide (ZIAC) 2.5-6.25 MG per tablet Take 1 tablet by mouth every morning.   Yes Historical Provider, MD  docusate sodium (COLACE) 100 MG capsule Take 100 mg by mouth daily.   Yes Historical Provider, MD  levothyroxine (SYNTHROID, LEVOTHROID) 100 MCG tablet Take 50 mcg by mouth every morning.   Yes Historical Provider, MD  Multiple Vitamin (MULITIVITAMIN WITH MINERALS) TABS Take 1 tablet by mouth every morning.   Yes Historical Provider, MD  simvastatin (ZOCOR) 20 MG tablet Take 20 mg by mouth every evening.   Yes Historical Provider, MD  sulfamethoxazole-trimethoprim (BACTRIM DS,SEPTRA DS) 800-160 MG per tablet Take 1 tablet by mouth 2 (two) times daily. For 5 days 09/02/14   Historical Provider, MD   BP 92/51 mmHg  Pulse 95  Temp(Src) 97.4 F (36.3 C) (Oral)  Resp 20  SpO2 99%   Physical Exam  Constitutional: She is oriented to person, place, and time. She appears well-developed and well-nourished.  HENT:  Head: Normocephalic and atraumatic.  Right Ear: Tympanic membrane and external ear normal.  Left Ear: Tympanic membrane and external ear normal.  Eyes: Conjunctivae and EOM are normal. Pupils are equal, round, and reactive to light.  Neck: Normal range of motion. Neck  supple.  Cardiovascular: Normal rate, regular rhythm, normal heart sounds and intact distal pulses.   Pulmonary/Chest: Effort normal and breath sounds normal.  Abdominal: Soft. Bowel sounds are normal. There is no tenderness.  Musculoskeletal: Normal range of motion.  Neurological: She is alert and oriented to person, place, and time.  Skin: Skin is warm and dry.  Vitals reviewed.   ED Course  Procedures (including critical care time) DIAGNOSTIC STUDIES: Oxygen Saturation is 99% on RA, normal by my interpretation.    COORDINATION OF  CARE: 12:08 AM-Discussed treatment plan with pt at bedside and pt agreed to plan.     Labs Review Labs Reviewed - No data to display  Imaging Review No results found.   EKG Interpretation None      MDM   Final diagnoses:  Ear popping, right      81 y.o. female with pertinent PMH of HTN, GERD presents with sensation of R ear popping, head fullness, resolved prior to exam.  My exam is completely benign. The patient did ask repetitive questions, however after repeated prompting is able to fully remember the entirety of exam. She is oriented to person, place, time. I discussed her overall functional status with an acquaintance in the room and she feels safe for the patient to return home. Suspect likely blocked eustachian tube which resolved prior to my exam. Doubt CVA, TIA, ACS given lack of other symptoms. Discharged home in stable condition..    I have reviewed all laboratory and imaging studies if ordered as above  1. Ear popping, right           Debby Freiberg, MD 09/08/14 [phone]

## 2014-09-08 NOTE — Discharge Instructions (Signed)

## 2014-11-26 DIAGNOSIS — Z23 Encounter for immunization: Secondary | ICD-10-CM | POA: Diagnosis not present

## 2015-03-08 DIAGNOSIS — E7800 Pure hypercholesterolemia, unspecified: Secondary | ICD-10-CM | POA: Diagnosis not present

## 2015-03-08 DIAGNOSIS — M810 Age-related osteoporosis without current pathological fracture: Secondary | ICD-10-CM | POA: Diagnosis not present

## 2015-03-08 DIAGNOSIS — Z6820 Body mass index (BMI) 20.0-20.9, adult: Secondary | ICD-10-CM | POA: Diagnosis not present

## 2015-03-08 DIAGNOSIS — I129 Hypertensive chronic kidney disease with stage 1 through stage 4 chronic kidney disease, or unspecified chronic kidney disease: Secondary | ICD-10-CM | POA: Diagnosis not present

## 2015-03-08 DIAGNOSIS — R7302 Impaired glucose tolerance (oral): Secondary | ICD-10-CM | POA: Diagnosis not present

## 2015-03-08 DIAGNOSIS — N183 Chronic kidney disease, stage 3 (moderate): Secondary | ICD-10-CM | POA: Diagnosis not present

## 2015-03-08 DIAGNOSIS — E039 Hypothyroidism, unspecified: Secondary | ICD-10-CM | POA: Diagnosis not present

## 2015-03-08 DIAGNOSIS — M1990 Unspecified osteoarthritis, unspecified site: Secondary | ICD-10-CM | POA: Diagnosis not present

## 2015-03-08 DIAGNOSIS — E038 Other specified hypothyroidism: Secondary | ICD-10-CM | POA: Diagnosis not present

## 2015-03-08 DIAGNOSIS — M859 Disorder of bone density and structure, unspecified: Secondary | ICD-10-CM | POA: Diagnosis not present

## 2015-03-08 DIAGNOSIS — R69 Illness, unspecified: Secondary | ICD-10-CM | POA: Diagnosis not present

## 2015-03-08 DIAGNOSIS — R413 Other amnesia: Secondary | ICD-10-CM | POA: Diagnosis not present

## 2015-03-30 ENCOUNTER — Encounter (HOSPITAL_COMMUNITY): Payer: Self-pay

## 2015-03-30 ENCOUNTER — Other Ambulatory Visit (HOSPITAL_COMMUNITY): Payer: Self-pay | Admitting: Internal Medicine

## 2015-03-30 ENCOUNTER — Ambulatory Visit (HOSPITAL_COMMUNITY)
Admission: RE | Admit: 2015-03-30 | Discharge: 2015-03-30 | Disposition: A | Discharge status: Home / Self Care | Payer: Medicare HMO | Source: Ambulatory Visit | Attending: Internal Medicine | Admitting: Internal Medicine

## 2015-03-30 DIAGNOSIS — M810 Age-related osteoporosis without current pathological fracture: Secondary | ICD-10-CM | POA: Diagnosis not present

## 2015-03-30 HISTORY — DX: Thyrotoxicosis, unspecified without thyrotoxic crisis or storm: E05.90

## 2015-03-30 HISTORY — DX: Age-related osteoporosis without current pathological fracture: M81.0

## 2015-03-30 MED ORDER — DENOSUMAB 60 MG/ML ~~LOC~~ SOLN
60.0000 mg | Freq: Once | SUBCUTANEOUS | Status: AC
  Administered 2015-03-30: 60 mg via SUBCUTANEOUS
  Filled 2015-03-30: qty 1

## 2015-03-30 NOTE — Discharge Instructions (Signed)
Denosumab injection  What is this medicine?  DENOSUMAB (den oh sue mab) slows bone breakdown. Prolia is used to treat osteoporosis in women after menopause and in men. Xgeva is used to prevent bone fractures and other bone problems caused by cancer bone metastases. Xgeva is also used to treat giant cell tumor of the bone.  This medicine may be used for other purposes; ask your health care provider or pharmacist if you have questions.  What should I tell my health care provider before I take this medicine?  They need to know if you have any of these conditions:  -dental disease  -eczema  -infection or history of infections  -kidney disease or on dialysis  -low blood calcium or vitamin D  -malabsorption syndrome  -scheduled to have surgery or tooth extraction  -taking medicine that contains denosumab  -thyroid or parathyroid disease  -an unusual reaction to denosumab, other medicines, foods, dyes, or preservatives  -pregnant or trying to get pregnant  -breast-feeding  How should I use this medicine?  This medicine is for injection under the skin. It is given by a health care professional in a hospital or clinic setting.  If you are getting Prolia, a special MedGuide will be given to you by the pharmacist with each prescription and refill. Be sure to read this information carefully each time.  For Prolia, talk to your pediatrician regarding the use of this medicine in children. Special care may be needed. For Xgeva, talk to your pediatrician regarding the use of this medicine in children. While this drug may be prescribed for children as young as 13 years for selected conditions, precautions do apply.  Overdosage: If you think you have taken too much of this medicine contact a poison control center or emergency room at once.  NOTE: This medicine is only for you. Do not share this medicine with others.  What if I miss a dose?  It is important not to miss your dose. Call your doctor or health care professional if you are  unable to keep an appointment.  What may interact with this medicine?  Do not take this medicine with any of the following medications:  -other medicines containing denosumab  This medicine may also interact with the following medications:  -medicines that suppress the immune system  -medicines that treat cancer  -steroid medicines like prednisone or cortisone  This list may not describe all possible interactions. Give your health care provider a list of all the medicines, herbs, non-prescription drugs, or dietary supplements you use. Also tell them if you smoke, drink alcohol, or use illegal drugs. Some items may interact with your medicine.  What should I watch for while using this medicine?  Visit your doctor or health care professional for regular checks on your progress. Your doctor or health care professional may order blood tests and other tests to see how you are doing.  Call your doctor or health care professional if you get a cold or other infection while receiving this medicine. Do not treat yourself. This medicine may decrease your body's ability to fight infection.  You should make sure you get enough calcium and vitamin D while you are taking this medicine, unless your doctor tells you not to. Discuss the foods you eat and the vitamins you take with your health care professional.  See your dentist regularly. Brush and floss your teeth as directed. Before you have any dental work done, tell your dentist you are receiving this medicine.  Do   not become pregnant while taking this medicine or for 5 months after stopping it. Women should inform their doctor if they wish to become pregnant or think they might be pregnant. There is a potential for serious side effects to an unborn child. Talk to your health care professional or pharmacist for more information.  What side effects may I notice from receiving this medicine?  Side effects that you should report to your doctor or health care professional as soon as  possible:  -allergic reactions like skin rash, itching or hives, swelling of the face, lips, or tongue  -breathing problems  -chest pain  -fast, irregular heartbeat  -feeling faint or lightheaded, falls  -fever, chills, or any other sign of infection  -muscle spasms, tightening, or twitches  -numbness or tingling  -skin blisters or bumps, or is dry, peels, or red  -slow healing or unexplained pain in the mouth or jaw  -unusual bleeding or bruising  Side effects that usually do not require medical attention (Report these to your doctor or health care professional if they continue or are bothersome.):  -muscle pain  -stomach upset, gas  This list may not describe all possible side effects. Call your doctor for medical advice about side effects. You may report side effects to FDA at 1-800-FDA-1088.  Where should I keep my medicine?  This medicine is only given in a clinic, doctor's office, or other health care setting and will not be stored at home.  NOTE: This sheet is a summary. It may not cover all possible information. If you have questions about this medicine, talk to your doctor, pharmacist, or health care provider.      2016, Elsevier/Gold Standard. (2011-08-05 12:37:47)

## 2015-04-03 ENCOUNTER — Telehealth: Payer: Self-pay | Admitting: Diagnostic Neuroimaging

## 2015-04-03 ENCOUNTER — Ambulatory Visit (INDEPENDENT_AMBULATORY_CARE_PROVIDER_SITE_OTHER): Payer: Medicare HMO | Admitting: Diagnostic Neuroimaging

## 2015-04-03 ENCOUNTER — Encounter: Payer: Self-pay | Admitting: Diagnostic Neuroimaging

## 2015-04-03 VITALS — BP 99/60 | HR 76 | Ht 65.0 in | Wt 120.2 lb

## 2015-04-03 DIAGNOSIS — R413 Other amnesia: Secondary | ICD-10-CM

## 2015-04-03 NOTE — Progress Notes (Signed)
[facility]  PATIENT: Pamela Ibarra DOB: December 16, 1928  REFERRING CLINICIAN: Tisovec HISTORY FROM: patient  REASON FOR VISIT: new consult    HISTORICAL  CHIEF COMPLAINT:  Chief Complaint  Patient presents with  . Memory Loss    rm 7, New Patient, MMSE 25    HISTORY OF PRESENT ILLNESS:   82 year old right-handed female here for evaluation of memory loss. Patient presents alone for this visit. She is a former retired Marine scientist at [employer]. Patient has been living alone since May 24, 2013 1 her husband passed away. Patient's son and daughter-in-law live 4 hours away in [place]. She spends some time with them, sometimes visiting living with them for one month at a time. Otherwise patient living on her own and maintaining her activities of daily living. She is able to bathe, Lacinda Axon, perform light household chores. She continues driving but has started to cut down on her own. She has not had any accidents or wandering.  Last few months patient's son noticed some memory lapses and then asked patient to be evaluated by PCP and then neurology. Patient does not think she has any significant memory problems.    REVIEW OF SYSTEMS: Full 14 system review of systems performed and notable only for dizziness memory loss hypertension.  ALLERGIES: Allergies  Allergen Reactions  . Lidocaine     "makes me crazy"  . Morphine And Related     "makes me crazy"    HOME MEDICATIONS: Outpatient Prescriptions Prior to Visit  Medication Sig Dispense Refill  . amLODipine (NORVASC) 10 MG tablet Take 10 mg by mouth every evening.    . bisoprolol-hydrochlorothiazide (ZIAC) 2.5-6.25 MG per tablet Take 1 tablet by mouth every morning.    . calcium citrate (CALCITRATE - DOSED IN MG ELEMENTAL CALCIUM) 950 MG tablet Take 1,000 mg of elemental calcium by mouth daily.    . cholecalciferol (VITAMIN D) 400 units TABS tablet Take 400 Units by mouth.    . docusate sodium (COLACE) 100 MG capsule  Take 100 mg by mouth daily.    Marland Kitchen levothyroxine (SYNTHROID, LEVOTHROID) 100 MCG tablet Take 50 mcg by mouth every morning.    . Multiple Vitamin (MULITIVITAMIN WITH MINERALS) TABS Take 1 tablet by mouth every morning.    . simvastatin (ZOCOR) 20 MG tablet Take 20 mg by mouth every evening.    Marland Kitchen acetaminophen (TYLENOL) 325 MG tablet Take 325-650 mg by mouth every 4 (four) hours as needed. Reported on 04/03/2015    . sulfamethoxazole-trimethoprim (BACTRIM DS,SEPTRA DS) 800-160 MG per tablet Take 1 tablet by mouth 2 (two) times daily. For 5 days  0   No facility-administered medications prior to visit.    PAST MEDICAL HISTORY: Past Medical History  Diagnosis Date  . Hypertension   . GERD (gastroesophageal reflux disease)   . Hyperthyroidism   . Osteoporosis   . Memory loss     PAST SURGICAL HISTORY: Past Surgical History  Procedure Laterality Date  . Abdominal hysterectomy    . Back surgery    . Tonsillectomy      FAMILY HISTORY: Family History  Problem Relation Age of Onset  . Diabetes Father   . Osteoporosis Sister     SOCIAL HISTORY:  Social History   Social History  . Marital Status: Married    Spouse Name: N/A  . Number of Children: 1  . Years of Education: 15   Occupational History  .      retired Programmer, [employer]  Social History Main Topics  . Smoking status: Never Smoker   . Smokeless tobacco: Not on file     Comment: very little smoking in nursing school  . Alcohol Use: No  . Drug Use: No  . Sexual Activity: Not on file   Other Topics Concern  . Not on file   Social History Narrative   Lives at home alone, spends some time with son in [place]   Caffeine use- none     PHYSICAL EXAM  GENERAL EXAM/CONSTITUTIONAL: Vitals:  Filed Vitals:   04/03/15 1103  BP: 99/60  Pulse: 76  Height: 5\' 5"  (1.651 m)  Weight: 120 lb 3.2 oz (54.522 kg)     Body mass index is 20 kg/(m^2).  No exam data present  Patient is in no distress; well developed,  nourished and groomed; neck is supple  CARDIOVASCULAR:  Examination of carotid arteries is normal; no carotid bruits  Regular rate and rhythm, no murmurs  Examination of peripheral vascular system by observation and palpation is normal  EYES:  Ophthalmoscopic exam of optic discs and posterior segments is normal; no papilledema or hemorrhages  MUSCULOSKELETAL:  Gait, strength, tone, movements noted in Neurologic exam below  NEUROLOGIC: MENTAL STATUS:  MMSE - Mini Mental State Exam 04/03/2015  Orientation to time 4  Orientation to Place 5  Registration 3  Attention/ Calculation 3  Recall 2  Language- name 2 objects 2  Language- repeat 0  Language- follow 3 step command 3  Language- read & follow direction 1  Write a sentence 1  Copy design 1  Total score 25    awake, alert, oriented to person, place and time  recent and remote memory intact  normal attention and concentration  language fluent, comprehension intact, naming intact,   fund of knowledge appropriate  CRANIAL NERVE:   2nd - no papilledema on fundoscopic exam  2nd, 3rd, 4th, 6th - pupils equal and reactive to light, visual fields full to confrontation, extraocular muscles intact, no nystagmus  5th - facial sensation symmetric  7th - facial strength symmetric  8th - hearing intact  9th - palate elevates symmetrically, uvula midline  11th - shoulder shrug symmetric  12th - tongue protrusion midline  MOTOR:   normal bulk and tone, full strength in the BUE, BLE  SENSORY:   normal and symmetric to light touch, pinprick, temperature, vibration  COORDINATION:   finger-nose-finger, fine finger movements normal  REFLEXES:   deep tendon reflexes present and symmetric; ABSENT AT ANKLES  NO FRONTAL RELEASE SIGNS  GAIT/STATION:   narrow based gait; able to walk tandem; romberg is negative    DIAGNOSTIC DATA (LABS, IMAGING, TESTING) - I reviewed patient records, labs, notes, testing and  imaging myself where available.  Lab Results  Component Value Date   WBC 9.7 05/25/2011   HGB 14.1 05/25/2011   HCT 39.8 05/25/2011   MCV 87.7 05/25/2011   PLT 252 05/25/2011      Component Value Date/Time   NA 133* 05/25/2011 1636   K 4.3 05/25/2011 1636   CL 92* 05/25/2011 1636   CO2 30 05/25/2011 1636   GLUCOSE 108* 05/25/2011 1636   BUN 18 05/25/2011 1636   CREATININE 0.84 05/25/2011 1636   CALCIUM 10.6* 05/25/2011 1636   PROT 7.2 05/25/2011 1636   ALBUMIN 4.4 05/25/2011 1636   AST 24 05/25/2011 1636   ALT 14 05/25/2011 1636   ALKPHOS 85 05/25/2011 1636   BILITOT 0.3 05/25/2011 1636   GFRNONAA 64* 05/25/2011  [place] 74* 05/25/2011 1636   No results found for: CHOL, HDL, LDLCALC, LDLDIRECT, TRIG, CHOLHDL No results found for: HGBA1C No results found for: VITAMINB12 No results found for: TSH     ASSESSMENT AND PLAN  82 y.o. year old female here with reported mild memory loss, short-term, notice more by her son that herself. MMSE 25 out of 30. Could represent normal aging, mild cognitive impairment or other etiology. I do not think patient has dementia at this time. I encouraged patient to return for next visit with family members to provide collateral history and information. General brain health advice given with respect to nutrition, physical activity, mental and social situation.   Ddx: normal aging, mild cognitive impairment, metabolic  1. Memory loss      PLAN: - monitor symptoms - return at next visit with family to provide more information  Return in about 6 months (around 10/01/2015).    Penni Bombard, MD [id number], [id number] AM Certified in Neurology, Neurophysiology and Neuroimaging  [facility] [address], [place], [place] [address] [phone]

## 2015-04-03 NOTE — Telephone Encounter (Signed)
Daughter in Law/Cher [phone] called to inquire about what was discussed at patient's visit today, states she received a phone call from patient who advised, family is to come with her to next appointment, wants to know the reason behind that, is something wrong? Please call to advise.

## 2015-04-03 NOTE — Patient Instructions (Signed)
Thank you for coming to see [place] at [facility]. I hope we have been able to provide you high quality care today.  You may receive a patient satisfaction survey over the next few weeks. We would appreciate your feedback and comments so that we may continue to improve ourselves and the health of our patients.  - I will check MRI brain   ~~~~~~~~~~~~~~~~~~~~~~~~~~~~~~~~~~~~~~~~~~~~~~~~~~~~~~~~~~~~~~~~~  DR. Yanette Tripoli'S GUIDE TO HAPPY AND HEALTHY LIVING These are some of my general health and wellness recommendations. Some of them may apply to you better than others. Please use common sense as you try these suggestions and feel free to ask me any questions.   ACTIVITY/FITNESS Mental, social, emotional and physical stimulation are very important for brain and body health. Try learning a new activity (arts, music, language, sports, games).  Keep moving your body to the best of your abilities. You can do this at home, inside or outside, the park, community center, gym or anywhere you like. Consider a physical therapist or personal trainer to get started. Consider the app Sworkit. Fitness trackers such as smart-watches, smart-phones or Fitbits can help as well.   NUTRITION Eat more plants: colorful vegetables, nuts, seeds and berries.  Eat less sugar, salt, preservatives and processed foods.  Avoid toxins such as cigarettes and alcohol.  Drink water when you are thirsty. Warm water with a slice of lemon is an excellent morning drink to start the day.  Consider these websites for more information The Nutrition Source (https://www.henry-hernandez.biz/) Precision Nutrition (WindowBlog.ch)   RELAXATION Consider practicing mindfulness meditation or other relaxation techniques such as deep breathing, prayer, yoga, tai chi, massage. See website mindful.org or the apps Headspace or Calm to help get started.   SLEEP Try to get at least  7-8+ hours sleep per day. Regular exercise and reduced caffeine will help you sleep better. Practice good sleep hygeine techniques. See website sleep.org for more information.   PLANNING Prepare estate planning, living will, healthcare POA documents. Sometimes this is best planned with the help of an attorney. Theconversationproject.org and agingwithdignity.org are excellent resources.

## 2015-04-03 NOTE — Telephone Encounter (Signed)
Spoke with Paulino Rily, daughter-in-law and informed her that Dr Gladstone Lighter OV note is incomplete at this time. Informed her that it is very useful to have family with memory patient's so that they can ask questions, hear what dr says, give input to patient's behaviors.  Advised the family keep journal of her activities, their concerns and bring with them to her next appointment. Also encouraged to call for sooner FU if they felt she needed to be seen sooner. Otherwise, monitor her behaviors. Cher verbalized understanding, appreciation.

## 2015-09-11 DIAGNOSIS — I10 Essential (primary) hypertension: Secondary | ICD-10-CM | POA: Diagnosis not present

## 2015-09-11 DIAGNOSIS — E038 Other specified hypothyroidism: Secondary | ICD-10-CM | POA: Diagnosis not present

## 2015-09-11 DIAGNOSIS — R7302 Impaired glucose tolerance (oral): Secondary | ICD-10-CM | POA: Diagnosis not present

## 2015-09-11 DIAGNOSIS — M810 Age-related osteoporosis without current pathological fracture: Secondary | ICD-10-CM | POA: Diagnosis not present

## 2015-09-13 DIAGNOSIS — K219 Gastro-esophageal reflux disease without esophagitis: Secondary | ICD-10-CM | POA: Diagnosis not present

## 2015-09-13 DIAGNOSIS — Z0000 Encounter for general adult medical examination without abnormal findings: Secondary | ICD-10-CM | POA: Diagnosis not present

## 2015-09-13 DIAGNOSIS — E038 Other specified hypothyroidism: Secondary | ICD-10-CM | POA: Diagnosis not present

## 2015-09-13 DIAGNOSIS — Z6820 Body mass index (BMI) 20.0-20.9, adult: Secondary | ICD-10-CM | POA: Diagnosis not present

## 2015-09-13 DIAGNOSIS — I129 Hypertensive chronic kidney disease with stage 1 through stage 4 chronic kidney disease, or unspecified chronic kidney disease: Secondary | ICD-10-CM | POA: Diagnosis not present

## 2015-09-13 DIAGNOSIS — R413 Other amnesia: Secondary | ICD-10-CM | POA: Diagnosis not present

## 2015-09-13 DIAGNOSIS — R69 Illness, unspecified: Secondary | ICD-10-CM | POA: Diagnosis not present

## 2015-09-13 DIAGNOSIS — M1990 Unspecified osteoarthritis, unspecified site: Secondary | ICD-10-CM | POA: Diagnosis not present

## 2015-09-13 DIAGNOSIS — R7302 Impaired glucose tolerance (oral): Secondary | ICD-10-CM | POA: Diagnosis not present

## 2015-09-13 DIAGNOSIS — N183 Chronic kidney disease, stage 3 (moderate): Secondary | ICD-10-CM | POA: Diagnosis not present

## 2015-09-28 ENCOUNTER — Telehealth: Payer: Self-pay | Admitting: Diagnostic Neuroimaging

## 2015-09-28 NOTE — Telephone Encounter (Signed)
Returned Passenger transport manager, daughter-in-law's call and spoke with her and patient's son on speaker phone at their request; both on DPR. Patient has FU next Monday, and Paulino Rily inquired as to "what Dr Gladstone Lighter Feb note stated and what will happen Monday". Discussed his plan/assessment portion of OV note and that RN will perform VS, med check, MMSE. She stated that patient and her husband relied on each other heavily, were married many years. She and patient's son Sudie Bailey stated that patient continues to live on her own but occasionally spends 3-5 weeks in mother-in-law suite in their home in [place]. Cher stated she has noticed patient will ask same questions repeatedly, occurring more often than it used to. Patient will ask how to do routine tasks such as laundry; this is new. The couple has gradually taken over paying her bills, taking her to dr's appointments, buying her groceries at times. Couple is concerned about her memory and whether this is normal aging or something else. They expressed they wish her to be independent as long as possible but also want to know how they can best help her. Paulino Rily will bring information for Dr Leta Baptist and has requested it be kept confidential. Advised that this RN can give it to him prior to him seeing patient. This RN discussed that she may still be adjusting to death of husband of many years and having to learn to do everything on her own.  Further suggested that as long as they live far apart, they will be limited as to how they can help her. Suggested that if she moves into their suite they will be much better able to help her, and can utilize resources in their town as well.  Paulino Rily will be with patient on Monday, and she expressed appreciation of assistance and call back.

## 2015-09-28 NOTE — Telephone Encounter (Signed)
Noted. -VRP 

## 2015-09-28 NOTE — Telephone Encounter (Signed)
Pamela Ibarra, daughter in law, requests call back regarding apt on Monday. Want to share info before apt. Best call back [phone]

## 2015-09-29 ENCOUNTER — Encounter (HOSPITAL_COMMUNITY): Payer: Self-pay

## 2015-09-29 ENCOUNTER — Ambulatory Visit (HOSPITAL_COMMUNITY)
Admission: RE | Admit: 2015-09-29 | Discharge: 2015-09-29 | Disposition: A | Discharge status: Home / Self Care | Payer: Medicare HMO | Source: Ambulatory Visit | Attending: Internal Medicine | Admitting: Internal Medicine

## 2015-09-29 DIAGNOSIS — M810 Age-related osteoporosis without current pathological fracture: Secondary | ICD-10-CM | POA: Insufficient documentation

## 2015-09-29 MED ORDER — DENOSUMAB 60 MG/ML ~~LOC~~ SOLN
60.0000 mg | Freq: Once | SUBCUTANEOUS | Status: AC
  Administered 2015-09-29: 60 mg via SUBCUTANEOUS
  Filled 2015-09-29: qty 1

## 2015-09-29 NOTE — Discharge Instructions (Signed)
Denosumab injection  What is this medicine?  DENOSUMAB (den oh sue mab) slows bone breakdown. Prolia is used to treat osteoporosis in women after menopause and in men. Xgeva is used to prevent bone fractures and other bone problems caused by cancer bone metastases. Xgeva is also used to treat giant cell tumor of the bone.  This medicine may be used for other purposes; ask your health care provider or pharmacist if you have questions.  What should I tell my health care provider before I take this medicine?  They need to know if you have any of these conditions:  -dental disease  -eczema  -infection or history of infections  -kidney disease or on dialysis  -low blood calcium or vitamin D  -malabsorption syndrome  -scheduled to have surgery or tooth extraction  -taking medicine that contains denosumab  -thyroid or parathyroid disease  -an unusual reaction to denosumab, other medicines, foods, dyes, or preservatives  -pregnant or trying to get pregnant  -breast-feeding  How should I use this medicine?  This medicine is for injection under the skin. It is given by a health care professional in a hospital or clinic setting.  If you are getting Prolia, a special MedGuide will be given to you by the pharmacist with each prescription and refill. Be sure to read this information carefully each time.  For Prolia, talk to your pediatrician regarding the use of this medicine in children. Special care may be needed. For Xgeva, talk to your pediatrician regarding the use of this medicine in children. While this drug may be prescribed for children as young as 13 years for selected conditions, precautions do apply.  Overdosage: If you think you have taken too much of this medicine contact a poison control center or emergency room at once.  NOTE: This medicine is only for you. Do not share this medicine with others.  What if I miss a dose?  It is important not to miss your dose. Call your doctor or health care professional if you are  unable to keep an appointment.  What may interact with this medicine?  Do not take this medicine with any of the following medications:  -other medicines containing denosumab  This medicine may also interact with the following medications:  -medicines that suppress the immune system  -medicines that treat cancer  -steroid medicines like prednisone or cortisone  This list may not describe all possible interactions. Give your health care provider a list of all the medicines, herbs, non-prescription drugs, or dietary supplements you use. Also tell them if you smoke, drink alcohol, or use illegal drugs. Some items may interact with your medicine.  What should I watch for while using this medicine?  Visit your doctor or health care professional for regular checks on your progress. Your doctor or health care professional may order blood tests and other tests to see how you are doing.  Call your doctor or health care professional if you get a cold or other infection while receiving this medicine. Do not treat yourself. This medicine may decrease your body's ability to fight infection.  You should make sure you get enough calcium and vitamin D while you are taking this medicine, unless your doctor tells you not to. Discuss the foods you eat and the vitamins you take with your health care professional.  See your dentist regularly. Brush and floss your teeth as directed. Before you have any dental work done, tell your dentist you are receiving this medicine.  Do   not become pregnant while taking this medicine or for 5 months after stopping it. Women should inform their doctor if they wish to become pregnant or think they might be pregnant. There is a potential for serious side effects to an unborn child. Talk to your health care professional or pharmacist for more information.  What side effects may I notice from receiving this medicine?  Side effects that you should report to your doctor or health care professional as soon as  possible:  -allergic reactions like skin rash, itching or hives, swelling of the face, lips, or tongue  -breathing problems  -chest pain  -fast, irregular heartbeat  -feeling faint or lightheaded, falls  -fever, chills, or any other sign of infection  -muscle spasms, tightening, or twitches  -numbness or tingling  -skin blisters or bumps, or is dry, peels, or red  -slow healing or unexplained pain in the mouth or jaw  -unusual bleeding or bruising  Side effects that usually do not require medical attention (Report these to your doctor or health care professional if they continue or are bothersome.):  -muscle pain  -stomach upset, gas  This list may not describe all possible side effects. Call your doctor for medical advice about side effects. You may report side effects to FDA at 1-800-FDA-1088.  Where should I keep my medicine?  This medicine is only given in a clinic, doctor's office, or other health care setting and will not be stored at home.  NOTE: This sheet is a summary. It may not cover all possible information. If you have questions about this medicine, talk to your doctor, pharmacist, or health care provider.      2016, Elsevier/Gold Standard. (2011-08-05 12:37:47)

## 2015-10-02 ENCOUNTER — Encounter: Payer: Self-pay | Admitting: Diagnostic Neuroimaging

## 2015-10-02 ENCOUNTER — Ambulatory Visit (INDEPENDENT_AMBULATORY_CARE_PROVIDER_SITE_OTHER): Payer: Medicare HMO | Admitting: Diagnostic Neuroimaging

## 2015-10-02 VITALS — BP 120/64 | HR 64 | Wt 119.6 lb

## 2015-10-02 DIAGNOSIS — R413 Other amnesia: Secondary | ICD-10-CM | POA: Diagnosis not present

## 2015-10-02 DIAGNOSIS — G3184 Mild cognitive impairment, so stated: Secondary | ICD-10-CM | POA: Diagnosis not present

## 2015-10-02 NOTE — Patient Instructions (Signed)
Thank you for coming to see [place] at [facility]. I hope we have been able to provide you high quality care today.  You may receive a patient satisfaction survey over the next few weeks. We would appreciate your feedback and comments so that we may continue to improve ourselves and the health of our patients.  - I will check MRI brain  - increase safety and supervision  - caution with bills, finances, driving   ~~~~~~~~~~~~~~~~~~~~~~~~~~~~~~~~~~~~~~~~~~~~~~~~~~~~~~~~~~~~~~~~~  DR. Anthonee Gelin'S GUIDE TO HAPPY AND HEALTHY LIVING These are some of my general health and wellness recommendations. Some of them may apply to you better than others. Please use common sense as you try these suggestions and feel free to ask me any questions.   ACTIVITY/FITNESS Mental, social, emotional and physical stimulation are very important for brain and body health. Try learning a new activity (arts, music, language, sports, games).  Keep moving your body to the best of your abilities. You can do this at home, inside or outside, the park, community center, gym or anywhere you like. Consider a physical therapist or personal trainer to get started. Consider the app Sworkit. Fitness trackers such as smart-watches, smart-phones or Fitbits can help as well.   NUTRITION Eat more plants: colorful vegetables, nuts, seeds and berries.  Eat less sugar, salt, preservatives and processed foods.  Avoid toxins such as cigarettes and alcohol.  Drink water when you are thirsty. Warm water with a slice of lemon is an excellent morning drink to start the day.  Consider these websites for more information The Nutrition Source (https://www.henry-hernandez.biz/) Precision Nutrition (WindowBlog.ch)   RELAXATION Consider practicing mindfulness meditation or other relaxation techniques such as deep breathing, prayer, yoga, tai chi, massage. See website mindful.org or  the apps Headspace or Calm to help get started.   SLEEP Try to get at least 7-8+ hours sleep per day. Regular exercise and reduced caffeine will help you sleep better. Practice good sleep hygeine techniques. See website sleep.org for more information.   PLANNING Prepare estate planning, living will, healthcare POA documents. Sometimes this is best planned with the help of an attorney. Theconversationproject.org and agingwithdignity.org are excellent resources.

## 2015-10-02 NOTE — Progress Notes (Signed)
[facility]  PATIENT: Pamela Ibarra DOB: 1928-08-06  REFERRING CLINICIAN: Tisovec HISTORY FROM: patient and daughter in law (son via phone) REASON FOR VISIT: follow up     Pamela Ibarra:  Chief Complaint  Patient presents with  . Memory Loss    rm 7, dgtr-in-law Pamela Ibarra, MMSE 24    HISTORY OF PRESENT ILLNESS:   UPDATE 10/02/15: Since last visit, pt feels stable. Son has noted short term memory loss, anxiety, diff with billing, more fatigue. Son notes that pt is more relaxed when living in [place] with son and daughter in law. Alternating every 4-6 weeks.   PRIOR HPI (04/03/15): 82 year old right-handed female here for evaluation of memory loss. Patient presents alone for this visit. She is a former retired Marine scientist at [employer]. Patient has been living alone since 2013-05-22 1 her husband passed away. Patient's son and daughter-in-law live 4 hours away in [place]. She spends some time with them, sometimes visiting living with them for one month at a time. Otherwise patient living on her own and maintaining her activities of daily living. She is able to bathe, cook, perform light household chores. She continues driving but has started to cut down on her own. She has not had any accidents or wandering. Last few months patient's son noticed some memory lapses and then asked patient to be evaluated by PCP and then neurology. Patient does not think she has any significant memory problems.    REVIEW OF SYSTEMS: Full 14 system review of systems performed and negative except anxiety.   ALLERGIES: Allergies  Allergen Reactions  . Lidocaine     "makes me crazy"  . Morphine And Related     "makes me crazy"    HOME MEDICATIONS: Outpatient Medications Prior to Visit  Medication Sig Dispense Refill  . acetaminophen (TYLENOL) 325 MG tablet Take 325-650 mg by mouth every 4 (four) hours as needed. Reported on 04/03/2015    . amLODipine (NORVASC) 10 MG tablet  Take 10 mg by mouth every evening.    . bisoprolol-hydrochlorothiazide (ZIAC) 2.5-6.25 MG per tablet Take 1 tablet by mouth every morning.    . calcium citrate (CALCITRATE - DOSED IN MG ELEMENTAL CALCIUM) 950 MG tablet Take 1,000 mg of elemental calcium by mouth daily.    . cholecalciferol (VITAMIN D) 400 units TABS tablet Take 400 Units by mouth.    . docusate sodium (COLACE) 100 MG capsule Take 100 mg by mouth daily.    Marland Kitchen levothyroxine (SYNTHROID, LEVOTHROID) 100 MCG tablet Take 50 mcg by mouth every morning.    . Multiple Vitamin (MULITIVITAMIN WITH MINERALS) TABS Take 1 tablet by mouth every morning.    . simvastatin (ZOCOR) 20 MG tablet Take 20 mg by mouth every evening.     No facility-administered medications prior to visit.     PAST MEDICAL HISTORY: Past Medical History:  Diagnosis Date  . GERD (gastroesophageal reflux disease)   . Hypertension   . Hyperthyroidism   . Memory loss   . Osteoporosis     PAST SURGICAL HISTORY: Past Surgical History:  Procedure Laterality Date  . ABDOMINAL HYSTERECTOMY    . BACK SURGERY    . TONSILLECTOMY      FAMILY HISTORY: Family History  Problem Relation Age of Onset  . Diabetes Father   . Osteoporosis Sister     SOCIAL HISTORY:  Social History   Social History  . Marital status: Married    Spouse name: N/A  .  Number of children: 1  . Years of education: 38   Occupational History  .      retired Programmer, [employer]   Social History Main Topics  . Smoking status: Never Smoker  . Smokeless tobacco: Never Used     Comment: very little smoking in nursing school  . Alcohol use No  . Drug use: No  . Sexual activity: Not on file   Other Topics Concern  . Not on file   Social History Narrative   Lives at home alone, spends some time with son in [place]   Caffeine use- none     PHYSICAL EXAM  GENERAL EXAM/CONSTITUTIONAL: Vitals:  Vitals:   10/02/15 1300  BP: 120/64  Pulse: 64  Weight: 119 lb 9.6 oz (54.3 kg)   Body  mass index is 19.6 kg/m. No exam data present  Patient is in no distress; well developed, nourished and groomed; neck is supple  CARDIOVASCULAR:  Examination of carotid arteries is normal; no carotid bruits  Regular rate and rhythm, no murmurs  Examination of peripheral vascular system by observation and palpation is normal  EYES:  Ophthalmoscopic exam of optic discs and posterior segments is normal; no papilledema or hemorrhages  MUSCULOSKELETAL:  Gait, strength, tone, movements noted in Neurologic exam below  NEUROLOGIC: MENTAL STATUS:  MMSE - Mini Mental State Exam 10/02/2015 04/03/2015  Orientation to time 4 4  Orientation to Place 5 5  Registration 3 3  Attention/ Calculation 5 3  Attention/Calculation-comments done correctly on 2nd try, was not asked to do again -  Recall 0 2  Language- name 2 objects 2 2  Language- repeat 0 0  Language- follow 3 step command 3 3  Language- read & follow direction 1 1  Write a sentence 1 1  Copy design 0 1  Total score 24 25    awake, alert, oriented to person, place and time  [facility] RECENT MEMORY  normal attention and concentration  language fluent, comprehension intact, naming intact,   fund of knowledge appropriate  CRANIAL NERVE:   2nd - no papilledema on fundoscopic exam  2nd, 3rd, 4th, 6th - pupils equal and reactive to light, visual fields full to confrontation, extraocular muscles intact, no nystagmus  5th - facial sensation symmetric  7th - facial strength symmetric  8th - hearing intact  9th - palate elevates symmetrically, uvula midline  11th - shoulder shrug symmetric  12th - tongue protrusion midline  MOTOR:   normal bulk and tone, full strength in the BUE, BLE  SENSORY:   normal and symmetric to light touch, temperature, vibration  COORDINATION:   finger-nose-finger, fine finger movements normal  REFLEXES:   deep tendon reflexes present and symmetric; ABSENT AT ANKLES  NO FRONTAL  RELEASE SIGNS  GAIT/STATION:   narrow based gait    DIAGNOSTIC DATA (LABS, IMAGING, TESTING) - I reviewed patient records, labs, notes, testing and imaging myself where available.  Lab Results  Component Value Date   WBC 9.7 05/25/2011   HGB 14.1 05/25/2011   HCT 39.8 05/25/2011   MCV 87.7 05/25/2011   PLT 252 05/25/2011      Component Value Date/Time   NA 133 (L) 05/25/2011 1636   K 4.3 05/25/2011 1636   CL 92 (L) 05/25/2011 1636   CO2 30 05/25/2011 1636   GLUCOSE 108 (H) 05/25/2011 1636   BUN 18 05/25/2011 1636   CREATININE 0.84 05/25/2011 1636   CALCIUM 10.6 (H) 05/25/2011 1636   PROT 7.2 05/25/2011  1636   ALBUMIN 4.4 05/25/2011 1636   AST 24 05/25/2011 1636   ALT 14 05/25/2011 1636   ALKPHOS 85 05/25/2011 1636   BILITOT 0.3 05/25/2011 1636   GFRNONAA 64 (L) 05/25/2011 1636   GFRAA 74 (L) 05/25/2011 1636   No results found for: CHOL, HDL, LDLCALC, LDLDIRECT, TRIG, CHOLHDL No results found for: HGBA1C No results found for: VITAMINB12 No results found for: TSH     ASSESSMENT AND PLAN  82 y.o. year old female here with reported mild memory loss, short-term, notice more by her son that herself. MMSE 25 --> 24. Could represent mild cognitive impairment vs mild dementia. General brain health advice given with respect to nutrition, physical activity, mental and social situation.   Ddx: mild cognitive impairment vs mild dementia   1. Memory loss   2. MCI (mild cognitive impairment)      PLAN: - monitor symptoms - check MRI brain - encouraged increased safety and supervision  Orders Placed This Encounter  Procedures  . MR Brain Wo Contrast   Return in about 6 months (around 04/03/2016).    Penni Bombard, MD [id number], [id number] PM Certified in Neurology, Neurophysiology and Neuroimaging  [facility] [address], [place], [place] [address] [phone]

## 2015-10-20 ENCOUNTER — Telehealth: Payer: Self-pay | Admitting: Diagnostic Neuroimaging

## 2015-10-20 NOTE — Telephone Encounter (Signed)
Alexandrea/ Holland Falling has called asking that the mri order be sent to a different facility who will accept [employer]. She also has pt's daughter in law on the phone, Paulino Rily, would like it to be within this area. Please call Cher to coordinate , [phone]. Please call back as soon as possible.  Pt had appt for MRI tomorrow but was just not notified [facility] is out of network.

## 2015-10-21 ENCOUNTER — Inpatient Hospital Stay: Admission: RE | Admit: 2015-10-21 | Payer: Private Health Insurance - Indemnity | Source: Ambulatory Visit

## 2015-11-02 NOTE — Telephone Encounter (Signed)
Returned call there was no option for VM.

## 2015-11-18 DIAGNOSIS — Z23 Encounter for immunization: Secondary | ICD-10-CM | POA: Diagnosis not present

## 2015-12-16 DIAGNOSIS — S61412A Laceration without foreign body of left hand, initial encounter: Secondary | ICD-10-CM | POA: Diagnosis not present

## 2015-12-19 ENCOUNTER — Telehealth: Payer: Self-pay | Admitting: Diagnostic Neuroimaging

## 2015-12-19 NOTE — Telephone Encounter (Signed)
Pt's daughter Garnett Farm called. She said the pt was scheduled for return OV on 11/14 to discuss MRI results. She advised the pt has not had the MRI. She is wanting the pt to have the MRI in [place] where she will be in-network with the insurance. The 11/14 OV has been c/a and she will call back to r/s once MRI is scheduled. Please call

## 2015-12-19 NOTE — Telephone Encounter (Signed)
Spoke with her daughter in law Cher back and forth all afternoon. I called her insurance company to see where her insurance is in network with to get her MRI. [facility] is in network with her insurance company. I spoke with Paulino Rily and informed her that I made an appt at [facility] on 12/30/15 at 4:45 pm. Right now the auth is pending due to I had to fax clinical notes to [facility].

## 2015-12-25 NOTE — Telephone Encounter (Signed)
[employer] Medicare AuthTK:[id number] (exp. 12/23/15 to 03/22/16).

## 2015-12-30 ENCOUNTER — Ambulatory Visit (HOSPITAL_COMMUNITY)
Admission: RE | Admit: 2015-12-30 | Discharge: 2015-12-30 | Disposition: A | Discharge status: Home / Self Care | Payer: Medicare HMO | Source: Ambulatory Visit | Attending: Diagnostic Neuroimaging | Admitting: Diagnostic Neuroimaging

## 2015-12-30 DIAGNOSIS — G3184 Mild cognitive impairment, so stated: Secondary | ICD-10-CM

## 2015-12-30 DIAGNOSIS — G319 Degenerative disease of nervous system, unspecified: Secondary | ICD-10-CM | POA: Insufficient documentation

## 2015-12-30 DIAGNOSIS — R413 Other amnesia: Secondary | ICD-10-CM | POA: Diagnosis present

## 2016-01-01 DIAGNOSIS — R413 Other amnesia: Secondary | ICD-10-CM | POA: Diagnosis not present

## 2016-01-02 ENCOUNTER — Telehealth: Payer: Self-pay | Admitting: Diagnostic Neuroimaging

## 2016-01-02 ENCOUNTER — Ambulatory Visit: Payer: Medicare HMO | Admitting: Diagnostic Neuroimaging

## 2016-01-02 NOTE — Telephone Encounter (Signed)
Moderate atrophy. No acute findings. Continue current plan. -VRP

## 2016-01-02 NOTE — Telephone Encounter (Signed)
Pt's daughter in law is requesting a call for MRI results. Please call Cher [phone]

## 2016-01-02 NOTE — Telephone Encounter (Signed)
Spoke with daughter-in-law, Paulino Rily and informed her, per Dr Leta Baptist, patient's MRI showed moderate brain atrophy; no acute findings. She inquired if that was what is causing her memory issues. Advised her it most likely is contributing to memory issues, but this RN cannot say with certainty; Dr Leta Baptist will have to discuss in follow up. She then stated the patient has a policy the son is most familiar with. It would allow for them to get additional help with patient who is living with them. She inquired what state of dementia patient has; this RN stated Dr Leta Baptist will have to give that information. Her questions are outside this RN's scope of knowledge, practice.  Offered to move her FU sooner; Cher requested it be in Dec. Rescheduled 03/20/16 FU to 02/06/16. Requested they arrive 15 min early. She stated she and patient's son will come with pt to FU. She verbalized understanding, appreciation for call.

## 2016-02-06 ENCOUNTER — Encounter: Payer: Self-pay | Admitting: Diagnostic Neuroimaging

## 2016-02-06 ENCOUNTER — Ambulatory Visit (INDEPENDENT_AMBULATORY_CARE_PROVIDER_SITE_OTHER): Payer: Medicare HMO | Admitting: Diagnostic Neuroimaging

## 2016-02-06 VITALS — BP 115/58 | HR 61 | Wt 121.2 lb

## 2016-02-06 DIAGNOSIS — F03A0 Unspecified dementia, mild, without behavioral disturbance, psychotic disturbance, mood disturbance, and anxiety: Secondary | ICD-10-CM

## 2016-02-06 DIAGNOSIS — F0390 Unspecified dementia without behavioral disturbance: Secondary | ICD-10-CM

## 2016-02-06 DIAGNOSIS — R69 Illness, unspecified: Secondary | ICD-10-CM | POA: Diagnosis not present

## 2016-02-06 NOTE — Patient Instructions (Signed)
Thank you for coming to see [place] at [facility]. I hope we have been able to provide you high quality care today.  You may receive a patient satisfaction survey over the next few weeks. We would appreciate your feedback and comments so that we may continue to improve ourselves and the health of our patients.   - establish with local primary care physician  - LDLive.be is an excellent resource  - increase safety and supervision  - consider home health care agency (physical therapy, nursing, aid if needed)    ~~~~~~~~~~~~~~~~~~~~~~~~~~~~~~~~~~~~~~~~~~~~~~~~~~~~~~~~~~~~~~~~~  DR. PENUMALLI'S GUIDE TO HAPPY AND HEALTHY LIVING These are some of my general health and wellness recommendations. Some of them may apply to you better than others. Please use common sense as you try these suggestions and feel free to ask me any questions.   ACTIVITY/FITNESS Mental, social, emotional and physical stimulation are very important for brain and body health. Try learning a new activity (arts, music, language, sports, games).  Keep moving your body to the best of your abilities. You can do this at home, inside or outside, the park, community center, gym or anywhere you like. Consider a physical therapist or personal trainer to get started. Consider the app Sworkit. Fitness trackers such as smart-watches, smart-phones or Fitbits can help as well.   NUTRITION Eat more plants: colorful vegetables, nuts, seeds and berries.  Eat less sugar, salt, preservatives and processed foods.  Avoid toxins such as cigarettes and alcohol.  Drink water when you are thirsty. Warm water with a slice of lemon is an excellent morning drink to start the day.  Consider these websites for more information The Nutrition Source (https://www.henry-hernandez.biz/) Precision Nutrition (WindowBlog.ch)   RELAXATION Consider practicing mindfulness meditation or other  relaxation techniques such as deep breathing, prayer, yoga, tai chi, massage. See website mindful.org or the apps Headspace or Calm to help get started.   SLEEP Try to get at least 7-8+ hours sleep per day. Regular exercise and reduced caffeine will help you sleep better. Practice good sleep hygeine techniques. See website sleep.org for more information.   PLANNING Prepare estate planning, living will, healthcare POA documents. Sometimes this is best planned with the help of an attorney. Theconversationproject.org and agingwithdignity.org are excellent resources.

## 2016-02-06 NOTE — Progress Notes (Signed)
[facility]  PATIENT: Pamela Ibarra DOB: 1928-07-09  REFERRING CLINICIAN: Tisovec HISTORY FROM: patient and daughter in law (son via phone) REASON FOR VISIT: follow up     [place]:  Chief Complaint  Patient presents with  . Memory Loss    rm 6 son- Dellis Filbert, dgtr in law- Cher, MMSE 24  . Follow-up    early FU req by family    HISTORY OF PRESENT ILLNESS:   UPDATE 02/07/16: Since last visit, Short-term memory loss continues. Patient able to take care of her morning routine, and waking up, getting dressed having breakfast. She has good recall of long-term events. Having more problems with repeating herself on certain short-term topics images paying taxes, doctor's appointment, bills, insurance coverage, medications and family visits. Patient got lost driving to the pharmacy where she normally goes recently. Patient now living with her son and daughter-in-law most of the time but still maintains a home in [place]. They live about 4 hours away in [place]. Patient having some issues misplacing objects. She had confusion renewing her insurance coverage. She also fell down in the parking lot 1 day at her grandson soccer game.  UPDATE 10/02/15: Since last visit, pt feels stable. Son has noted short term memory loss, anxiety, diff with billing, more fatigue. Son notes that pt is more relaxed when living in [place] with son and daughter in law. Alternating every 4-6 weeks.   PRIOR HPI (04/03/15): 82 year old right-handed female here for evaluation of memory loss. Patient presents alone for this visit. She is a former retired Marine scientist at [employer]. Patient has been living alone since 06-02-2013 1 her husband passed away. Patient's son and daughter-in-law live 4 hours away in [place]. She spends some time with them, sometimes visiting living with them for one month at a time. Otherwise patient living on her own and maintaining her activities of daily  living. She is able to bathe, cook, perform light household chores. She continues driving but has started to cut down on her own. She has not had any accidents or wandering. Last few months patient's son noticed some memory lapses and then asked patient to be evaluated by PCP and then neurology. Patient does not think she has any significant memory problems.    REVIEW OF SYSTEMS: Full 14 system review of systems performed and negative except anxiety.   ALLERGIES: Allergies  Allergen Reactions  . Lidocaine     "makes me crazy"  . Morphine And Related     "makes me crazy"    HOME MEDICATIONS: Outpatient Medications Prior to Visit  Medication Sig Dispense Refill  . amLODipine (NORVASC) 10 MG tablet Take 10 mg by mouth every evening.    . bisoprolol-hydrochlorothiazide (ZIAC) 2.5-6.25 MG per tablet Take 1 tablet by mouth every morning.    . calcium citrate (CALCITRATE - DOSED IN MG ELEMENTAL CALCIUM) 950 MG tablet Take 1,000 mg of elemental calcium by mouth daily.    Marland Kitchen docusate sodium (COLACE) 100 MG capsule Take 100 mg by mouth daily.    Marland Kitchen levothyroxine (SYNTHROID, LEVOTHROID) 100 MCG tablet Take 50 mcg by mouth every morning.    . Multiple Vitamin (MULITIVITAMIN WITH MINERALS) TABS Take 1 tablet by mouth every morning.    . simvastatin (ZOCOR) 20 MG tablet Take 20 mg by mouth every evening.    Marland Kitchen acetaminophen (TYLENOL) 325 MG tablet Take 325-650 mg by mouth every 4 (four) hours as needed. Reported on 04/03/2015    .  cholecalciferol (VITAMIN D) 400 units TABS tablet Take 400 Units by mouth.     No facility-administered medications prior to visit.     PAST MEDICAL HISTORY: Past Medical History:  Diagnosis Date  . GERD (gastroesophageal reflux disease)   . Hypertension   . Hyperthyroidism   . Memory loss   . Osteoporosis     PAST SURGICAL HISTORY: Past Surgical History:  Procedure Laterality Date  . ABDOMINAL HYSTERECTOMY    . BACK SURGERY    . TONSILLECTOMY      FAMILY  HISTORY: Family History  Problem Relation Age of Onset  . Diabetes Father   . Osteoporosis Sister     SOCIAL HISTORY:  Social History   Social History  . Marital status: Married    Spouse name: N/A  . Number of children: 1  . Years of education: 33   Occupational History  .      retired Programmer, systems [employer]   Social History Main Topics  . Smoking status: Never Smoker  . Smokeless tobacco: Never Used     Comment: very little smoking in nursing school  . Alcohol use No  . Drug use: No  . Sexual activity: Not on file   Other Topics Concern  . Not on file   Social History Narrative   Lives at home alone, spends some time with son in [place]   Caffeine use- none     PHYSICAL EXAM  GENERAL EXAM/CONSTITUTIONAL: Vitals:  Vitals:   02/06/16 1100  BP: (!) 115/58  Pulse: 61  Weight: 121 lb 3.2 oz (55 kg)   Body mass index is 19.86 kg/m. No exam data present  Patient is in no distress; well developed, nourished and groomed; neck is supple  CARDIOVASCULAR:  Examination of carotid arteries is normal; no carotid bruits  Regular rate and rhythm, no murmurs  Examination of peripheral vascular system by observation and palpation is normal  EYES:  Ophthalmoscopic exam of optic discs and posterior segments is normal; no papilledema or hemorrhages  MUSCULOSKELETAL:  Gait, strength, tone, movements noted in Neurologic exam below  NEUROLOGIC: MENTAL STATUS:  MMSE - Mini Mental State Exam 02/06/2016 10/02/2015 04/03/2015  Orientation to time 5 4 4   Orientation to Place 5 5 5   Registration 3 3 3   Attention/ Calculation 3 5 3   Attention/Calculation-comments - done correctly on 2nd try, was not asked to do again -  Recall 0 0 2  Language- name 2 objects 2 2 2   Language- repeat 0 0 0  Language- follow 3 step command 3 3 3   Language- read & follow direction 1 1 1   Write a sentence 1 1 1   Copy design 1 0 1  Total score 24 24 25     awake, alert, oriented to person, place  and time  DECR RECENT MEMORY  normal attention and concentration  language fluent, comprehension intact, naming intact,   fund of knowledge appropriate  CRANIAL NERVE:   2nd - no papilledema on fundoscopic exam  2nd, 3rd, 4th, 6th - pupils equal and reactive to light, visual fields full to confrontation, extraocular muscles intact, no nystagmus  5th - facial sensation symmetric  7th - facial strength symmetric  8th - hearing intact  9th - palate elevates symmetrically, uvula midline  11th - shoulder shrug symmetric  12th - tongue protrusion midline  MOTOR:   normal bulk and tone, full strength in the BUE, BLE  SENSORY:   normal and symmetric to light touch, temperature, vibration  COORDINATION:   finger-nose-finger, fine finger movements normal  REFLEXES:   deep tendon reflexes present and symmetric; ABSENT AT ANKLES  NO FRONTAL RELEASE SIGNS  GAIT/STATION:   narrow based gait    DIAGNOSTIC DATA (LABS, IMAGING, TESTING) - I reviewed patient records, labs, notes, testing and imaging myself where available.  Lab Results  Component Value Date   WBC 9.7 05/25/2011   HGB 14.1 05/25/2011   HCT 39.8 05/25/2011   MCV 87.7 05/25/2011   PLT 252 05/25/2011      Component Value Date/Time   NA 133 (L) 05/25/2011 1636   K 4.3 05/25/2011 1636   CL 92 (L) 05/25/2011 1636   CO2 30 05/25/2011 1636   GLUCOSE 108 (H) 05/25/2011 1636   BUN 18 05/25/2011 1636   CREATININE 0.84 05/25/2011 1636   CALCIUM 10.6 (H) 05/25/2011 1636   PROT 7.2 05/25/2011 1636   ALBUMIN 4.4 05/25/2011 1636   AST 24 05/25/2011 1636   ALT 14 05/25/2011 1636   ALKPHOS 85 05/25/2011 1636   BILITOT 0.3 05/25/2011 1636   GFRNONAA 64 (L) 05/25/2011 1636   GFRAA 74 (L) 05/25/2011 1636   No results found for: CHOL, HDL, LDLCALC, LDLDIRECT, TRIG, CHOLHDL No results found for: HGBA1C No results found for: VITAMINB12 No results found for: TSH     ASSESSMENT AND PLAN  82 y.o. year  old female here with reported mild memory loss, short-term, notice more by her son that herself. MMSE 25 --> 24. Could represent mild cognitive impairment vs mild dementia, although neurodegenerative dementia is more likely given the progression of symptoms and impact on her activities of daily living. General brain health advice given with respect to nutrition, physical activity, mental and social situation.   Dx: mild dementia   1. Mild dementia      PLAN: - Encouraged increased safety and supervision - Advised patient and family on brain healthy activities - Offered information on medications such as donepezil or memantine, but given limited benefit, they would like to hold off at this time. - Encouraged patient to establish a local PCP in [place], who would also be able to set up home health agency referral and home health aide as needed  Return in about 7 months (around 09/05/2016).    Penni Bombard, MD [id number], [id number] AM Certified in Neurology, Neurophysiology and Neuroimaging  [facility] [address], [place], [place] [address] [phone]

## 2016-02-22 ENCOUNTER — Telehealth: Payer: Self-pay | Admitting: *Deleted

## 2016-02-22 NOTE — Telephone Encounter (Signed)
Spoke with patient's daughter-in-law, Paulino Rily and reschedule patient's 7 month follow up. Provider will be out of the office. She verbalized understanding.

## 2016-03-15 DIAGNOSIS — E7800 Pure hypercholesterolemia, unspecified: Secondary | ICD-10-CM | POA: Diagnosis not present

## 2016-03-15 DIAGNOSIS — R7302 Impaired glucose tolerance (oral): Secondary | ICD-10-CM | POA: Diagnosis not present

## 2016-03-15 DIAGNOSIS — M1990 Unspecified osteoarthritis, unspecified site: Secondary | ICD-10-CM | POA: Diagnosis not present

## 2016-03-15 DIAGNOSIS — I129 Hypertensive chronic kidney disease with stage 1 through stage 4 chronic kidney disease, or unspecified chronic kidney disease: Secondary | ICD-10-CM | POA: Diagnosis not present

## 2016-03-15 DIAGNOSIS — K219 Gastro-esophageal reflux disease without esophagitis: Secondary | ICD-10-CM | POA: Diagnosis not present

## 2016-03-15 DIAGNOSIS — N183 Chronic kidney disease, stage 3 (moderate): Secondary | ICD-10-CM | POA: Diagnosis not present

## 2016-03-15 DIAGNOSIS — R413 Other amnesia: Secondary | ICD-10-CM | POA: Diagnosis not present

## 2016-03-15 DIAGNOSIS — E038 Other specified hypothyroidism: Secondary | ICD-10-CM | POA: Diagnosis not present

## 2016-03-15 DIAGNOSIS — M810 Age-related osteoporosis without current pathological fracture: Secondary | ICD-10-CM | POA: Diagnosis not present

## 2016-03-15 DIAGNOSIS — I10 Essential (primary) hypertension: Secondary | ICD-10-CM | POA: Diagnosis not present

## 2016-03-20 ENCOUNTER — Ambulatory Visit: Payer: Medicare HMO | Admitting: Diagnostic Neuroimaging

## 2016-04-01 ENCOUNTER — Ambulatory Visit (HOSPITAL_COMMUNITY): Admission: RE | Admit: 2016-04-01 | Payer: Medicare HMO | Source: Ambulatory Visit

## 2016-04-09 ENCOUNTER — Encounter (HOSPITAL_COMMUNITY): Payer: Self-pay

## 2016-04-09 ENCOUNTER — Ambulatory Visit (HOSPITAL_COMMUNITY)
Admission: RE | Admit: 2016-04-09 | Discharge: 2016-04-09 | Disposition: A | Discharge status: Home / Self Care | Payer: Medicare HMO | Source: Ambulatory Visit | Attending: Internal Medicine | Admitting: Internal Medicine

## 2016-04-09 DIAGNOSIS — M810 Age-related osteoporosis without current pathological fracture: Secondary | ICD-10-CM | POA: Diagnosis not present

## 2016-04-09 MED ORDER — DENOSUMAB 60 MG/ML ~~LOC~~ SOLN
60.0000 mg | Freq: Once | SUBCUTANEOUS | Status: AC
  Administered 2016-04-09: 60 mg via SUBCUTANEOUS
  Filled 2016-04-09: qty 1

## 2016-04-09 NOTE — Discharge Instructions (Signed)
Prolia Denosumab injection What is this medicine? DENOSUMAB (den oh sue mab) slows bone breakdown. Prolia is used to treat osteoporosis in women after menopause and in men. Delton See is used to prevent bone fractures and other bone problems caused by cancer bone metastases. Delton See is also used to treat giant cell tumor of the bone. COMMON BRAND NAME(S): Prolia, XGEVA What should I tell my health care provider before I take this medicine? They need to know if you have any of these conditions: -dental disease -eczema -infection or history of infections -kidney disease or on dialysis -low blood calcium or vitamin D -malabsorption syndrome -scheduled to have surgery or tooth extraction -taking medicine that contains denosumab -thyroid or parathyroid disease -an unusual reaction to denosumab, other medicines, foods, dyes, or preservatives -pregnant or trying to get pregnant -breast-feeding How should I use this medicine? This medicine is for injection under the skin. It is given by a health care professional in a hospital or clinic setting. If you are getting Prolia, a special MedGuide will be given to you by the pharmacist with each prescription and refill. Be sure to read this information carefully each time. For Prolia, talk to your pediatrician regarding the use of this medicine in children. Special care may be needed. For Delton See, talk to your pediatrician regarding the use of this medicine in children. While this drug may be prescribed for children as young as 13 years for selected conditions, precautions do apply. What if I miss a dose? It is important not to miss your dose. Call your doctor or health care professional if you are unable to keep an appointment. What may interact with this medicine? Do not take this medicine with any of the following medications: -other medicines containing denosumab This medicine may also interact with the following medications: -medicines that suppress  the immune system -medicines that treat cancer -steroid medicines like prednisone or cortisone What should I watch for while using this medicine? Visit your doctor or health care professional for regular checks on your progress. Your doctor or health care professional may order blood tests and other tests to see how you are doing. Call your doctor or health care professional if you get a cold or other infection while receiving this medicine. Do not treat yourself. This medicine may decrease your body's ability to fight infection. You should make sure you get enough calcium and vitamin D while you are taking this medicine, unless your doctor tells you not to. Discuss the foods you eat and the vitamins you take with your health care professional. See your dentist regularly. Brush and floss your teeth as directed. Before you have any dental work done, tell your dentist you are receiving this medicine. Do not become pregnant while taking this medicine or for 5 months after stopping it. Women should inform their doctor if they wish to become pregnant or think they might be pregnant. There is a potential for serious side effects to an unborn child. Talk to your health care professional or pharmacist for more information. What side effects may I notice from receiving this medicine? Side effects that you should report to your doctor or health care professional as soon as possible: -allergic reactions like skin rash, itching or hives, swelling of the face, lips, or tongue -breathing problems -chest pain -fast, irregular heartbeat -feeling faint or lightheaded, falls -fever, chills, or any other sign of infection -muscle spasms, tightening, or twitches -numbness or tingling -skin blisters or bumps, or is dry, peels,  or red -slow healing or unexplained pain in the mouth or jaw -unusual bleeding or bruising Side effects that usually do not require medical attention (report to your doctor or health care  professional if they continue or are bothersome): -muscle pain -stomach upset, gas Where should I keep my medicine? This medicine is only given in a clinic, doctor's office, or other health care setting and will not be stored at home.  2017 Elsevier/Gold Standard (2015-03-09 10:06:55)

## 2016-09-11 ENCOUNTER — Encounter: Payer: Self-pay | Admitting: Diagnostic Neuroimaging

## 2016-09-11 ENCOUNTER — Ambulatory Visit (INDEPENDENT_AMBULATORY_CARE_PROVIDER_SITE_OTHER): Payer: Medicare HMO | Admitting: Diagnostic Neuroimaging

## 2016-09-11 ENCOUNTER — Encounter (INDEPENDENT_AMBULATORY_CARE_PROVIDER_SITE_OTHER): Payer: Self-pay

## 2016-09-11 VITALS — BP 95/56 | HR 59 | Ht 65.5 in | Wt 115.0 lb

## 2016-09-11 DIAGNOSIS — E7800 Pure hypercholesterolemia, unspecified: Secondary | ICD-10-CM | POA: Diagnosis not present

## 2016-09-11 DIAGNOSIS — R7302 Impaired glucose tolerance (oral): Secondary | ICD-10-CM | POA: Diagnosis not present

## 2016-09-11 DIAGNOSIS — F0390 Unspecified dementia without behavioral disturbance: Secondary | ICD-10-CM

## 2016-09-11 DIAGNOSIS — R69 Illness, unspecified: Secondary | ICD-10-CM | POA: Diagnosis not present

## 2016-09-11 DIAGNOSIS — F03A0 Unspecified dementia, mild, without behavioral disturbance, psychotic disturbance, mood disturbance, and anxiety: Secondary | ICD-10-CM

## 2016-09-11 DIAGNOSIS — R413 Other amnesia: Secondary | ICD-10-CM | POA: Diagnosis not present

## 2016-09-11 DIAGNOSIS — I10 Essential (primary) hypertension: Secondary | ICD-10-CM | POA: Diagnosis not present

## 2016-09-11 DIAGNOSIS — M810 Age-related osteoporosis without current pathological fracture: Secondary | ICD-10-CM | POA: Diagnosis not present

## 2016-09-11 DIAGNOSIS — E038 Other specified hypothyroidism: Secondary | ICD-10-CM | POA: Diagnosis not present

## 2016-09-11 DIAGNOSIS — N183 Chronic kidney disease, stage 3 (moderate): Secondary | ICD-10-CM | POA: Diagnosis not present

## 2016-09-11 NOTE — Patient Instructions (Addendum)
-   review website [email or web address]

## 2016-09-11 NOTE — Progress Notes (Signed)
[facility]  PATIENT: Pamela Ibarra DOB: 10-11-1928  REFERRING CLINICIAN: Tisovec HISTORY FROM: patient and son REASON FOR VISIT: follow up     HISTORICAL  CHIEF COMPLAINT:  Chief Complaint  Patient presents with  . Follow-up  . Dementia    Stable for memory.  Questioning L eye blood shot x 2  (stroke).      HISTORY OF PRESENT ILLNESS:   UPDATE 09/11/16: Since last visit, patient now living with son and his family in [place]. Patient's family has kindly set up her own bedroom, bathroom, kitchen area. There home is on 1 acre and fully gated. Patient eats kneels with family, attends kids events including soccer, track meets an art. She attends church with family. She attends holiday meals with family and friends. She also helps to set the table and clean up after dinner. Patient is still independent with personal hygiene, taking medications, keeping her area tidy. Patient does have some issues with paranoia, misplacing objects, accusatory behavior, difficulty with understanding and comprehension. Patient no longer drives her car. She still has poor insight and does not feel that she has significant memory problems. Family is encouraging her to participate in other activities including hobbies and brainstem living activities, the patient seems to have little motivation in these areas. This leads to frustration for the family.  Separately 5 days ago patient had some redness in her left eye. This resolved within a few days. However this morning her left eye is significantly red. She has some burning sensation left eye. No change in visual acuity.  UPDATE 02/07/16: Since last visit, Short-term memory loss continues. Patient able to take care of her morning routine, and waking up, getting dressed having breakfast. She has good recall of long-term events. Having more problems with repeating herself on certain short-term topics images paying taxes, doctor's appointment,  bills, insurance coverage, medications and family visits. Patient got lost driving to the pharmacy where she normally goes recently. Patient now living with her son and daughter-in-law most of the time but still maintains a home in [place]. They live about 4 hours away in [place]. Patient having some issues misplacing objects. She had confusion renewing her insurance coverage. She also fell down in the parking lot 1 day at her grandson soccer game.  UPDATE 10/02/15: Since last visit, pt feels stable. Son has noted short term memory loss, anxiety, diff with billing, more fatigue. Son notes that pt is more relaxed when living in [place] with son and daughter in law. Alternating every 4-6 weeks.   PRIOR HPI (04/03/15): 83 year old right-handed female here for evaluation of memory loss. Patient presents alone for this visit. She is a former retired Marine scientist at [employer]. Patient has been living alone since 05-27-13 1 her husband passed away. Patient's son and daughter-in-law live 4 hours away in [place]. She spends some time with them, sometimes visiting living with them for one month at a time. Otherwise patient living on her own and maintaining her activities of daily living. She is able to bathe, cook, perform light household chores. She continues driving but has started to cut down on her own. She has not had any accidents or wandering. Last few months patient's son noticed some memory lapses and then asked patient to be evaluated by PCP and then neurology. Patient does not think she has any significant memory problems.   REVIEW OF SYSTEMS: Full 14 system review of systems performed and negative except: eye redness blurred vision.  ALLERGIES: Allergies  Allergen Reactions  . Lidocaine     "makes me crazy"  . Morphine And Related     "makes me crazy"    HOME MEDICATIONS: Outpatient Medications Prior to Visit  Medication Sig Dispense Refill  . acetaminophen (TYLENOL) 325 MG tablet Take  325-650 mg by mouth every 4 (four) hours as needed. Reported on 04/03/2015    . amLODipine (NORVASC) 10 MG tablet Take 10 mg by mouth every evening.    . bisoprolol-hydrochlorothiazide (ZIAC) 2.5-6.25 MG per tablet Take 1 tablet by mouth every morning.    . calcium citrate (CALCITRATE - DOSED IN MG ELEMENTAL CALCIUM) 950 MG tablet Take 1,000 mg of elemental calcium by mouth daily.    . cholecalciferol (VITAMIN D) 400 units TABS tablet Take 400 Units by mouth.    . docusate sodium (COLACE) 100 MG capsule Take 100 mg by mouth daily.    Marland Kitchen levothyroxine (SYNTHROID, LEVOTHROID) 100 MCG tablet Take 50 mcg by mouth every morning.    . Multiple Vitamin (MULITIVITAMIN WITH MINERALS) TABS Take 1 tablet by mouth every morning.    . simvastatin (ZOCOR) 20 MG tablet Take 20 mg by mouth every evening.     No facility-administered medications prior to visit.     PAST MEDICAL HISTORY: Past Medical History:  Diagnosis Date  . GERD (gastroesophageal reflux disease)   . Hypertension   . Hyperthyroidism   . Memory loss   . Osteoporosis     PAST SURGICAL HISTORY: Past Surgical History:  Procedure Laterality Date  . ABDOMINAL HYSTERECTOMY    . BACK SURGERY    . TONSILLECTOMY      FAMILY HISTORY: Family History  Problem Relation Age of Onset  . Diabetes Father   . Osteoporosis Sister   . Stroke Sister   . Dementia Sister     SOCIAL HISTORY:  Social History   Social History  . Marital status: Married    Spouse name: N/A  . Number of children: 1  . Years of education: 29   Occupational History  .      retired Programmer, [employer]   Social History Main Topics  . Smoking status: Never Smoker  . Smokeless tobacco: Never Used     Comment: very little smoking in nursing school  . Alcohol use No  . Drug use: No  . Sexual activity: Not on file   Other Topics Concern  . Not on file   Social History Narrative   Lives at home alone, spends some time with son in [place]   Caffeine use- none      PHYSICAL EXAM  GENERAL EXAM/CONSTITUTIONAL: Vitals:  Vitals:   09/11/16 1111  BP: (!) 95/56  Pulse: (!) 59  Weight: 115 lb (52.2 kg)  Height: 5' 5.5" (1.664 m)   Body mass index is 18.85 kg/m.  Visual Acuity Screening   Right eye Left eye Both eyes  Without correction:     With correction: 20/30 20/40     Patient is in no distress; well developed, nourished and groomed; neck is supple  CARDIOVASCULAR:  Examination of carotid arteries is normal; no carotid bruits  Regular rate and rhythm, no murmurs  Examination of peripheral vascular system by observation and palpation is normal  EYES:  Ophthalmoscopic exam --> LEFT EYE SUBCONJUNCTIVAL HEMORRHAGE; OTHERWISE optic discs and posterior segments is normal; no papilledema  MUSCULOSKELETAL:  Gait, strength, tone, movements noted in Neurologic exam below  NEUROLOGIC: MENTAL STATUS:  MMSE - Mini Mental  State Exam 09/11/2016 02/06/2016 10/02/2015  Orientation to time 4 5 4   Orientation to Place 4 5 5   Registration 3 3 3   Attention/ Calculation 3 3 5   Attention/Calculation-comments - - done correctly on 2nd try, was not asked to do again  Recall 1 0 0  Language- name 2 objects 2 2 2   Language- repeat 0 0 0  Language- follow 3 step command 3 3 3   Language- read & follow direction 1 1 1   Write a sentence 1 1 1   Copy design 1 1 0  Total score 23 24 24     awake, alert, oriented to person, place and time  [facility] RECENT MEMORY  normal attention and concentration  language fluent, comprehension intact, naming intact,   fund of knowledge appropriate  DECR INSIGHT  CALM, PLEASANT  CRANIAL NERVE:   2nd - no papilledema on fundoscopic exam  2nd, 3rd, 4th, 6th - pupils equal and reactive to light, visual fields full to confrontation, extraocular muscles intact, no nystagmus  5th - facial sensation symmetric  7th - facial strength symmetric  8th - hearing intact  9th - palate elevates symmetrically, uvula  midline  11th - shoulder shrug symmetric  12th - tongue protrusion midline  MOTOR:   normal bulk and tone, full strength in the BUE, BLE  SENSORY:   normal and symmetric to light touch, temperature, vibration  COORDINATION:   finger-nose-finger, fine finger movements normal  REFLEXES:   deep tendon reflexes present and symmetric; ABSENT AT ANKLES  NO FRONTAL RELEASE SIGNS  GAIT/STATION:   narrow based gait    DIAGNOSTIC DATA (LABS, IMAGING, TESTING) - I reviewed patient records, labs, notes, testing and imaging myself where available.  Lab Results  Component Value Date   WBC 9.7 05/25/2011   HGB 14.1 05/25/2011   HCT 39.8 05/25/2011   MCV 87.7 05/25/2011   PLT 252 05/25/2011      Component Value Date/Time   NA 133 (L) 05/25/2011 1636   K 4.3 05/25/2011 1636   CL 92 (L) 05/25/2011 1636   CO2 30 05/25/2011 1636   GLUCOSE 108 (H) 05/25/2011 1636   BUN 18 05/25/2011 1636   CREATININE 0.84 05/25/2011 1636   CALCIUM 10.6 (H) 05/25/2011 1636   PROT 7.2 05/25/2011 1636   ALBUMIN 4.4 05/25/2011 1636   AST 24 05/25/2011 1636   ALT 14 05/25/2011 1636   ALKPHOS 85 05/25/2011 1636   BILITOT 0.3 05/25/2011 1636   GFRNONAA 64 (L) 05/25/2011 1636   GFRAA 74 (L) 05/25/2011 1636   No results found for: CHOL, HDL, LDLCALC, LDLDIRECT, TRIG, CHOLHDL No results found for: HGBA1C No results found for: VITAMINB12 No results found for: TSH   12/30/15 MRI brain 1. 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 2. No acute or reversible finding.      ASSESSMENT AND PLAN  83 y.o. year old female here with reported mild memory loss, short-term, noticed more by her son and family than herself. Likely represents neurodegenerative dementia. General brain health advice given with respect to nutrition, physical activity, mental and social situation.   Dx: mild dementia   1. Mild dementia   2. Memory  loss      PLAN: I spent 50 minutes of face to face time with patient. Greater than 50% of time was spent in counseling and coordination of care with patient. In summary we discussed:   - encouraged increased safety and supervision -  advised patient and family on brain healthy activities - offered information on medications such as donepezil or memantine, but given limited benefit, they would like to hold off at this time. - encouraged patient to establish with local PCP and neurology in [place]  Return if symptoms worsen or fail to improve, for return to PCP.    Penni Bombard, MD 1/61/0960, 45:40 AM Certified in Neurology, Neurophysiology and Neuroimaging  [facility] [address] [place], [place] [address] [phone]

## 2016-09-12 DIAGNOSIS — H1132 Conjunctival hemorrhage, left eye: Secondary | ICD-10-CM | POA: Diagnosis not present

## 2016-09-18 ENCOUNTER — Ambulatory Visit: Payer: Medicare HMO | Admitting: Diagnostic Neuroimaging

## 2016-09-18 DIAGNOSIS — Z0000 Encounter for general adult medical examination without abnormal findings: Secondary | ICD-10-CM | POA: Diagnosis not present

## 2016-09-18 DIAGNOSIS — R7302 Impaired glucose tolerance (oral): Secondary | ICD-10-CM | POA: Diagnosis not present

## 2016-09-18 DIAGNOSIS — M1990 Unspecified osteoarthritis, unspecified site: Secondary | ICD-10-CM | POA: Diagnosis not present

## 2016-09-18 DIAGNOSIS — N183 Chronic kidney disease, stage 3 (moderate): Secondary | ICD-10-CM | POA: Diagnosis not present

## 2016-09-18 DIAGNOSIS — M810 Age-related osteoporosis without current pathological fracture: Secondary | ICD-10-CM | POA: Diagnosis not present

## 2016-09-18 DIAGNOSIS — H9193 Unspecified hearing loss, bilateral: Secondary | ICD-10-CM | POA: Diagnosis not present

## 2016-09-18 DIAGNOSIS — E038 Other specified hypothyroidism: Secondary | ICD-10-CM | POA: Diagnosis not present

## 2016-09-18 DIAGNOSIS — R69 Illness, unspecified: Secondary | ICD-10-CM | POA: Diagnosis not present

## 2016-09-18 DIAGNOSIS — I129 Hypertensive chronic kidney disease with stage 1 through stage 4 chronic kidney disease, or unspecified chronic kidney disease: Secondary | ICD-10-CM | POA: Diagnosis not present

## 2016-09-20 DIAGNOSIS — Z1212 Encounter for screening for malignant neoplasm of rectum: Secondary | ICD-10-CM | POA: Diagnosis not present

## 2016-10-08 ENCOUNTER — Encounter (HOSPITAL_COMMUNITY): Payer: Self-pay

## 2016-10-08 ENCOUNTER — Ambulatory Visit (HOSPITAL_COMMUNITY)
Admission: RE | Admit: 2016-10-08 | Discharge: 2016-10-08 | Disposition: A | Discharge status: Home / Self Care | Payer: Medicare HMO | Source: Ambulatory Visit | Attending: Internal Medicine | Admitting: Internal Medicine

## 2016-10-08 DIAGNOSIS — M810 Age-related osteoporosis without current pathological fracture: Secondary | ICD-10-CM | POA: Diagnosis not present

## 2016-10-08 MED ORDER — DENOSUMAB 60 MG/ML ~~LOC~~ SOLN
60.0000 mg | Freq: Once | SUBCUTANEOUS | Status: AC
  Administered 2016-10-08: 60 mg via SUBCUTANEOUS
  Filled 2016-10-08: qty 1

## 2016-10-08 NOTE — Discharge Instructions (Signed)
Prolia Denosumab injection What is this medicine? DENOSUMAB (den oh sue mab) slows bone breakdown. Prolia is used to treat osteoporosis in women after menopause and in men. Delton See is used to treat a high calcium level due to cancer and to prevent bone fractures and other bone problems caused by multiple myeloma or cancer bone metastases. Delton See is also used to treat giant cell tumor of the bone. This medicine may be used for other purposes; ask your health care provider or pharmacist if you have questions. COMMON BRAND NAME(S): Prolia, XGEVA What should I tell my health care provider before I take this medicine? They need to know if you have any of these conditions: -dental disease -having surgery or tooth extraction -infection -kidney disease -low levels of calcium or Vitamin D in the blood -malnutrition -on hemodialysis -skin conditions or sensitivity -thyroid or parathyroid disease -an unusual reaction to denosumab, other medicines, foods, dyes, or preservatives -pregnant or trying to get pregnant -breast-feeding How should I use this medicine? This medicine is for injection under the skin. It is given by a health care professional in a hospital or clinic setting. If you are getting Prolia, a special MedGuide will be given to you by the pharmacist with each prescription and refill. Be sure to read this information carefully each time. For Prolia, talk to your pediatrician regarding the use of this medicine in children. Special care may be needed. For Delton See, talk to your pediatrician regarding the use of this medicine in children. While this drug may be prescribed for children as young as 13 years for selected conditions, precautions do apply. Overdosage: If you think you have taken too much of this medicine contact a poison control center or emergency room at once. NOTE: This medicine is only for you. Do not share this medicine with others. What if I miss a dose? It is important not  to miss your dose. Call your doctor or health care professional if you are unable to keep an appointment. What may interact with this medicine? Do not take this medicine with any of the following medications: -other medicines containing denosumab This medicine may also interact with the following medications: -medicines that lower your chance of fighting infection -steroid medicines like prednisone or cortisone This list may not describe all possible interactions. Give your health care provider a list of all the medicines, herbs, non-prescription drugs, or dietary supplements you use. Also tell them if you smoke, drink alcohol, or use illegal drugs. Some items may interact with your medicine. What should I watch for while using this medicine? Visit your doctor or health care professional for regular checks on your progress. Your doctor or health care professional may order blood tests and other tests to see how you are doing. Call your doctor or health care professional for advice if you get a fever, chills or sore throat, or other symptoms of a cold or flu. Do not treat yourself. This drug may decrease your body's ability to fight infection. Try to avoid being around people who are sick. You should make sure you get enough calcium and vitamin D while you are taking this medicine, unless your doctor tells you not to. Discuss the foods you eat and the vitamins you take with your health care professional. See your dentist regularly. Brush and floss your teeth as directed. Before you have any dental work done, tell your dentist you are receiving this medicine. Do not become pregnant while taking this medicine or for  months after stopping it. Talk with your doctor or health care professional about your birth control options while taking this medicine. Women should inform their doctor if they wish to become pregnant or think they might be pregnant. There is a potential for serious side effects to an unborn  child. Talk to your health care professional or pharmacist for more information. °What side effects may I notice from receiving this medicine? °Side effects that you should report to your doctor or health care professional as soon as possible: °-allergic reactions like skin rash, itching or hives, swelling of the face, lips, or tongue °-bone pain °-breathing problems °-dizziness °-jaw pain, especially after dental work °-redness, blistering, peeling of the skin °-signs and symptoms of infection like fever or chills; cough; sore throat; pain or trouble passing urine °-signs of low calcium like fast heartbeat, muscle cramps or muscle pain; pain, tingling, numbness in the hands or feet; seizures °-unusual bleeding or bruising °-unusually weak or tired °Side effects that usually do not require medical attention (report to your doctor or health care professional if they continue or are bothersome): °-constipation °-diarrhea °-headache °-joint pain °-loss of appetite °-muscle pain °-runny nose °-tiredness °-upset stomach °This list may not describe all possible side effects. Call your doctor for medical advice about side effects. You may report side effects to FDA at 1-800-FDA-1088. °Where should I keep my medicine? °This medicine is only given in a clinic, doctor's office, or other health care setting and will not be stored at home. °NOTE: This sheet is a summary. It may not cover all possible information. If you have questions about this medicine, talk to your doctor, pharmacist, or health care provider. °© 2018 Elsevier/Gold Standard (2016-02-27 19:17:21) ° °

## 2016-10-09 DIAGNOSIS — L578 Other skin changes due to chronic exposure to nonionizing radiation: Secondary | ICD-10-CM | POA: Diagnosis not present

## 2016-10-09 DIAGNOSIS — C44729 Squamous cell carcinoma of skin of left lower limb, including hip: Secondary | ICD-10-CM | POA: Diagnosis not present

## 2016-10-09 DIAGNOSIS — L570 Actinic keratosis: Secondary | ICD-10-CM | POA: Diagnosis not present

## 2016-10-09 DIAGNOSIS — D492 Neoplasm of unspecified behavior of bone, soft tissue, and skin: Secondary | ICD-10-CM | POA: Diagnosis not present

## 2016-10-09 DIAGNOSIS — L821 Other seborrheic keratosis: Secondary | ICD-10-CM | POA: Diagnosis not present

## 2016-10-09 DIAGNOSIS — D485 Neoplasm of uncertain behavior of skin: Secondary | ICD-10-CM | POA: Diagnosis not present

## 2016-10-17 DIAGNOSIS — H903 Sensorineural hearing loss, bilateral: Secondary | ICD-10-CM | POA: Diagnosis not present

## 2016-11-01 DIAGNOSIS — H2513 Age-related nuclear cataract, bilateral: Secondary | ICD-10-CM | POA: Diagnosis not present

## 2016-11-06 DIAGNOSIS — L905 Scar conditions and fibrosis of skin: Secondary | ICD-10-CM | POA: Diagnosis not present

## 2016-11-06 DIAGNOSIS — D485 Neoplasm of uncertain behavior of skin: Secondary | ICD-10-CM | POA: Diagnosis not present

## 2016-11-06 DIAGNOSIS — C44729 Squamous cell carcinoma of skin of left lower limb, including hip: Secondary | ICD-10-CM | POA: Diagnosis not present

## 2016-12-24 DIAGNOSIS — Z4802 Encounter for removal of sutures: Secondary | ICD-10-CM | POA: Diagnosis not present

## 2016-12-24 DIAGNOSIS — L0101 Non-bullous impetigo: Secondary | ICD-10-CM | POA: Diagnosis not present

## 2016-12-24 DIAGNOSIS — L97829 Non-pressure chronic ulcer of other part of left lower leg with unspecified severity: Secondary | ICD-10-CM | POA: Diagnosis not present

## 2017-01-12 DIAGNOSIS — Z23 Encounter for immunization: Secondary | ICD-10-CM | POA: Diagnosis not present

## 2017-01-29 DIAGNOSIS — Z08 Encounter for follow-up examination after completed treatment for malignant neoplasm: Secondary | ICD-10-CM | POA: Diagnosis not present

## 2017-01-29 DIAGNOSIS — L0101 Non-bullous impetigo: Secondary | ICD-10-CM | POA: Diagnosis not present

## 2017-01-29 DIAGNOSIS — Z85828 Personal history of other malignant neoplasm of skin: Secondary | ICD-10-CM | POA: Diagnosis not present

## 2017-01-29 DIAGNOSIS — Z48817 Encounter for surgical aftercare following surgery on the skin and subcutaneous tissue: Secondary | ICD-10-CM | POA: Diagnosis not present

## 2017-01-29 DIAGNOSIS — L570 Actinic keratosis: Secondary | ICD-10-CM | POA: Diagnosis not present

## 2017-01-29 DIAGNOSIS — L578 Other skin changes due to chronic exposure to nonionizing radiation: Secondary | ICD-10-CM | POA: Diagnosis not present

## 2017-03-04 DIAGNOSIS — Z4802 Encounter for removal of sutures: Secondary | ICD-10-CM | POA: Diagnosis not present

## 2017-03-04 DIAGNOSIS — Z48817 Encounter for surgical aftercare following surgery on the skin and subcutaneous tissue: Secondary | ICD-10-CM | POA: Diagnosis not present

## 2017-03-04 DIAGNOSIS — L570 Actinic keratosis: Secondary | ICD-10-CM | POA: Diagnosis not present

## 2017-03-19 DIAGNOSIS — E038 Other specified hypothyroidism: Secondary | ICD-10-CM | POA: Diagnosis not present

## 2017-03-19 DIAGNOSIS — N183 Chronic kidney disease, stage 3 (moderate): Secondary | ICD-10-CM | POA: Diagnosis not present

## 2017-03-19 DIAGNOSIS — Z6820 Body mass index (BMI) 20.0-20.9, adult: Secondary | ICD-10-CM | POA: Diagnosis not present

## 2017-03-19 DIAGNOSIS — R7302 Impaired glucose tolerance (oral): Secondary | ICD-10-CM | POA: Diagnosis not present

## 2017-03-19 DIAGNOSIS — E7800 Pure hypercholesterolemia, unspecified: Secondary | ICD-10-CM | POA: Diagnosis not present

## 2017-03-19 DIAGNOSIS — R69 Illness, unspecified: Secondary | ICD-10-CM | POA: Diagnosis not present

## 2017-03-19 DIAGNOSIS — I10 Essential (primary) hypertension: Secondary | ICD-10-CM | POA: Diagnosis not present

## 2017-03-19 DIAGNOSIS — I129 Hypertensive chronic kidney disease with stage 1 through stage 4 chronic kidney disease, or unspecified chronic kidney disease: Secondary | ICD-10-CM | POA: Diagnosis not present

## 2017-04-11 ENCOUNTER — Ambulatory Visit (HOSPITAL_COMMUNITY): Payer: Medicare Other

## 2017-04-16 ENCOUNTER — Ambulatory Visit (HOSPITAL_COMMUNITY)
Admission: RE | Admit: 2017-04-16 | Discharge: 2017-04-16 | Disposition: A | Discharge status: Home / Self Care | Payer: Medicare HMO | Source: Ambulatory Visit | Attending: Internal Medicine | Admitting: Internal Medicine

## 2017-04-16 ENCOUNTER — Encounter (HOSPITAL_COMMUNITY): Payer: Self-pay

## 2017-04-16 DIAGNOSIS — M810 Age-related osteoporosis without current pathological fracture: Secondary | ICD-10-CM | POA: Insufficient documentation

## 2017-04-16 MED ORDER — DENOSUMAB 60 MG/ML ~~LOC~~ SOLN
60.0000 mg | Freq: Once | SUBCUTANEOUS | Status: AC
  Administered 2017-04-16: 60 mg via SUBCUTANEOUS
  Filled 2017-04-16: qty 1

## 2017-04-16 NOTE — Discharge Instructions (Signed)
Denosumab injection °What is this medicine? °DENOSUMAB (den oh sue mab) slows bone breakdown. Prolia is used to treat osteoporosis in women after menopause and in men. Xgeva is used to treat a high calcium level due to cancer and to prevent bone fractures and other bone problems caused by multiple myeloma or cancer bone metastases. Xgeva is also used to treat giant cell tumor of the bone. °This medicine may be used for other purposes; ask your health care provider or pharmacist if you have questions. °COMMON BRAND NAME(S): Prolia, XGEVA °What should I tell my health care provider before I take this medicine? °They need to know if you have any of these conditions: °-dental disease °-having surgery or tooth extraction °-infection °-kidney disease °-low levels of calcium or Vitamin D in the blood °-malnutrition °-on hemodialysis °-skin conditions or sensitivity °-thyroid or parathyroid disease °-an unusual reaction to denosumab, other medicines, foods, dyes, or preservatives °-pregnant or trying to get pregnant °-breast-feeding °How should I use this medicine? °This medicine is for injection under the skin. It is given by a health care professional in a hospital or clinic setting. °If you are getting Prolia, a special MedGuide will be given to you by the pharmacist with each prescription and refill. Be sure to read this information carefully each time. °For Prolia, talk to your pediatrician regarding the use of this medicine in children. Special care may be needed. For Xgeva, talk to your pediatrician regarding the use of this medicine in children. While this drug may be prescribed for children as young as 13 years for selected conditions, precautions do apply. °Overdosage: If you think you have taken too much of this medicine contact a poison control center or emergency room at once. °NOTE: This medicine is only for you. Do not share this medicine with others. °What if I miss a dose? °It is important not to miss your  dose. Call your doctor or health care professional if you are unable to keep an appointment. °What may interact with this medicine? °Do not take this medicine with any of the following medications: °-other medicines containing denosumab °This medicine may also interact with the following medications: °-medicines that lower your chance of fighting infection °-steroid medicines like prednisone or cortisone °This list may not describe all possible interactions. Give your health care provider a list of all the medicines, herbs, non-prescription drugs, or dietary supplements you use. Also tell them if you smoke, drink alcohol, or use illegal drugs. Some items may interact with your medicine. °What should I watch for while using this medicine? °Visit your doctor or health care professional for regular checks on your progress. Your doctor or health care professional may order blood tests and other tests to see how you are doing. °Call your doctor or health care professional for advice if you get a fever, chills or sore throat, or other symptoms of a cold or flu. Do not treat yourself. This drug may decrease your body's ability to fight infection. Try to avoid being around people who are sick. °You should make sure you get enough calcium and vitamin D while you are taking this medicine, unless your doctor tells you not to. Discuss the foods you eat and the vitamins you take with your health care professional. °See your dentist regularly. Brush and floss your teeth as directed. Before you have any dental work done, tell your dentist you are receiving this medicine. °Do not become pregnant while taking this medicine or for 5 months after stopping   it. Talk with your doctor or health care professional about your birth control options while taking this medicine. Women should inform their doctor if they wish to become pregnant or think they might be pregnant. There is a potential for serious side effects to an unborn child. Talk  to your health care professional or pharmacist for more information. What side effects may I notice from receiving this medicine? Side effects that you should report to your doctor or health care professional as soon as possible: -allergic reactions like skin rash, itching or hives, swelling of the face, lips, or tongue -bone pain -breathing problems -dizziness -jaw pain, especially after dental work -redness, blistering, peeling of the skin -signs and symptoms of infection like fever or chills; cough; sore throat; pain or trouble passing urine -signs of low calcium like fast heartbeat, muscle cramps or muscle pain; pain, tingling, numbness in the hands or feet; seizures -unusual bleeding or bruising -unusually weak or tired Side effects that usually do not require medical attention (report to your doctor or health care professional if they continue or are bothersome): -constipation -diarrhea -headache -joint pain -loss of appetite -muscle pain -runny nose -tiredness -upset stomach This list may not describe all possible side effects. Call your doctor for medical advice about side effects. You may report side effects to FDA at 1-800-FDA-1088. Where should I keep my medicine? This medicine is only given in a clinic, doctor's office, or other health care setting and will not be stored at home. NOTE: This sheet is a summary. It may not cover all possible information. If you have questions about this medicine, talk to your doctor, pharmacist, or health care provider.  2018 [employer] (2016-02-27 19:17:21)

## 2017-04-25 DIAGNOSIS — J069 Acute upper respiratory infection, unspecified: Secondary | ICD-10-CM | POA: Diagnosis not present

## 2017-06-25 DIAGNOSIS — Z79899 Other long term (current) drug therapy: Secondary | ICD-10-CM | POA: Diagnosis not present

## 2017-06-25 DIAGNOSIS — Z08 Encounter for follow-up examination after completed treatment for malignant neoplasm: Secondary | ICD-10-CM | POA: Diagnosis not present

## 2017-06-25 DIAGNOSIS — R69 Illness, unspecified: Secondary | ICD-10-CM | POA: Diagnosis not present

## 2017-06-25 DIAGNOSIS — L814 Other melanin hyperpigmentation: Secondary | ICD-10-CM | POA: Diagnosis not present

## 2017-06-25 DIAGNOSIS — Z4802 Encounter for removal of sutures: Secondary | ICD-10-CM | POA: Diagnosis not present

## 2017-06-25 DIAGNOSIS — F0280 Dementia in other diseases classified elsewhere without behavioral disturbance: Secondary | ICD-10-CM | POA: Diagnosis not present

## 2017-06-25 DIAGNOSIS — G301 Alzheimer's disease with late onset: Secondary | ICD-10-CM | POA: Diagnosis not present

## 2017-06-25 DIAGNOSIS — I10 Essential (primary) hypertension: Secondary | ICD-10-CM | POA: Diagnosis not present

## 2017-06-25 DIAGNOSIS — D485 Neoplasm of uncertain behavior of skin: Secondary | ICD-10-CM | POA: Diagnosis not present

## 2017-06-25 DIAGNOSIS — L538 Other specified erythematous conditions: Secondary | ICD-10-CM | POA: Diagnosis not present

## 2017-06-25 DIAGNOSIS — Z85828 Personal history of other malignant neoplasm of skin: Secondary | ICD-10-CM | POA: Diagnosis not present

## 2017-06-25 DIAGNOSIS — L298 Other pruritus: Secondary | ICD-10-CM | POA: Diagnosis not present

## 2017-06-25 DIAGNOSIS — L570 Actinic keratosis: Secondary | ICD-10-CM | POA: Diagnosis not present

## 2017-06-25 DIAGNOSIS — I951 Orthostatic hypotension: Secondary | ICD-10-CM | POA: Diagnosis not present

## 2017-06-25 DIAGNOSIS — L821 Other seborrheic keratosis: Secondary | ICD-10-CM | POA: Diagnosis not present

## 2017-09-23 DIAGNOSIS — R69 Illness, unspecified: Secondary | ICD-10-CM | POA: Diagnosis not present

## 2017-09-23 DIAGNOSIS — H9193 Unspecified hearing loss, bilateral: Secondary | ICD-10-CM | POA: Diagnosis not present

## 2017-09-23 DIAGNOSIS — N183 Chronic kidney disease, stage 3 (moderate): Secondary | ICD-10-CM | POA: Diagnosis not present

## 2017-09-23 DIAGNOSIS — G309 Alzheimer's disease, unspecified: Secondary | ICD-10-CM | POA: Diagnosis not present

## 2017-09-23 DIAGNOSIS — R82998 Other abnormal findings in urine: Secondary | ICD-10-CM | POA: Diagnosis not present

## 2017-09-23 DIAGNOSIS — R7302 Impaired glucose tolerance (oral): Secondary | ICD-10-CM | POA: Diagnosis not present

## 2017-09-23 DIAGNOSIS — E038 Other specified hypothyroidism: Secondary | ICD-10-CM | POA: Diagnosis not present

## 2017-09-23 DIAGNOSIS — E7800 Pure hypercholesterolemia, unspecified: Secondary | ICD-10-CM | POA: Diagnosis not present

## 2017-09-23 DIAGNOSIS — Z0000 Encounter for general adult medical examination without abnormal findings: Secondary | ICD-10-CM | POA: Diagnosis not present

## 2017-09-23 DIAGNOSIS — M1990 Unspecified osteoarthritis, unspecified site: Secondary | ICD-10-CM | POA: Diagnosis not present

## 2017-09-23 DIAGNOSIS — I129 Hypertensive chronic kidney disease with stage 1 through stage 4 chronic kidney disease, or unspecified chronic kidney disease: Secondary | ICD-10-CM | POA: Diagnosis not present

## 2017-09-23 DIAGNOSIS — M810 Age-related osteoporosis without current pathological fracture: Secondary | ICD-10-CM | POA: Diagnosis not present

## 2017-10-08 DIAGNOSIS — I10 Essential (primary) hypertension: Secondary | ICD-10-CM | POA: Diagnosis not present

## 2017-10-08 DIAGNOSIS — G301 Alzheimer's disease with late onset: Secondary | ICD-10-CM | POA: Diagnosis not present

## 2017-10-08 DIAGNOSIS — R69 Illness, unspecified: Secondary | ICD-10-CM | POA: Diagnosis not present

## 2017-10-15 ENCOUNTER — Ambulatory Visit (HOSPITAL_COMMUNITY)
Admission: RE | Admit: 2017-10-15 | Discharge: 2017-10-15 | Disposition: A | Discharge status: Home / Self Care | Payer: Medicare HMO | Source: Ambulatory Visit | Attending: Internal Medicine | Admitting: Internal Medicine

## 2017-10-15 ENCOUNTER — Encounter (HOSPITAL_COMMUNITY): Payer: Self-pay

## 2017-10-15 DIAGNOSIS — M810 Age-related osteoporosis without current pathological fracture: Secondary | ICD-10-CM | POA: Insufficient documentation

## 2017-10-15 MED ORDER — DENOSUMAB 60 MG/ML ~~LOC~~ SOSY
60.0000 mg | PREFILLED_SYRINGE | Freq: Once | SUBCUTANEOUS | Status: AC
  Administered 2017-10-15: 60 mg via SUBCUTANEOUS
  Filled 2017-10-15: qty 1

## 2017-10-15 NOTE — Discharge Instructions (Signed)
Denosumab injection °What is this medicine? °DENOSUMAB (den oh sue mab) slows bone breakdown. Prolia is used to treat osteoporosis in women after menopause and in men. Xgeva is used to treat a high calcium level due to cancer and to prevent bone fractures and other bone problems caused by multiple myeloma or cancer bone metastases. Xgeva is also used to treat giant cell tumor of the bone. °This medicine may be used for other purposes; ask your health care provider or pharmacist if you have questions. °COMMON BRAND NAME(S): Prolia, XGEVA °What should I tell my health care provider before I take this medicine? °They need to know if you have any of these conditions: °-dental disease °-having surgery or tooth extraction °-infection °-kidney disease °-low levels of calcium or Vitamin D in the blood °-malnutrition °-on hemodialysis °-skin conditions or sensitivity °-thyroid or parathyroid disease °-an unusual reaction to denosumab, other medicines, foods, dyes, or preservatives °-pregnant or trying to get pregnant °-breast-feeding °How should I use this medicine? °This medicine is for injection under the skin. It is given by a health care professional in a hospital or clinic setting. °If you are getting Prolia, a special MedGuide will be given to you by the pharmacist with each prescription and refill. Be sure to read this information carefully each time. °For Prolia, talk to your pediatrician regarding the use of this medicine in children. Special care may be needed. For Xgeva, talk to your pediatrician regarding the use of this medicine in children. While this drug may be prescribed for children as young as 13 years for selected conditions, precautions do apply. °Overdosage: If you think you have taken too much of this medicine contact a poison control center or emergency room at once. °NOTE: This medicine is only for you. Do not share this medicine with others. °What if I miss a dose? °It is important not to miss your  dose. Call your doctor or health care professional if you are unable to keep an appointment. °What may interact with this medicine? °Do not take this medicine with any of the following medications: °-other medicines containing denosumab °This medicine may also interact with the following medications: °-medicines that lower your chance of fighting infection °-steroid medicines like prednisone or cortisone °This list may not describe all possible interactions. Give your health care provider a list of all the medicines, herbs, non-prescription drugs, or dietary supplements you use. Also tell them if you smoke, drink alcohol, or use illegal drugs. Some items may interact with your medicine. °What should I watch for while using this medicine? °Visit your doctor or health care professional for regular checks on your progress. Your doctor or health care professional may order blood tests and other tests to see how you are doing. °Call your doctor or health care professional for advice if you get a fever, chills or sore throat, or other symptoms of a cold or flu. Do not treat yourself. This drug may decrease your body's ability to fight infection. Try to avoid being around people who are sick. °You should make sure you get enough calcium and vitamin D while you are taking this medicine, unless your doctor tells you not to. Discuss the foods you eat and the vitamins you take with your health care professional. °See your dentist regularly. Brush and floss your teeth as directed. Before you have any dental work done, tell your dentist you are receiving this medicine. °Do not become pregnant while taking this medicine or for 5 months after stopping   it. Talk with your doctor or health care professional about your birth control options while taking this medicine. Women should inform their doctor if they wish to become pregnant or think they might be pregnant. There is a potential for serious side effects to an unborn child. Talk  to your health care professional or pharmacist for more information. What side effects may I notice from receiving this medicine? Side effects that you should report to your doctor or health care professional as soon as possible: -allergic reactions like skin rash, itching or hives, swelling of the face, lips, or tongue -bone pain -breathing problems -dizziness -jaw pain, especially after dental work -redness, blistering, peeling of the skin -signs and symptoms of infection like fever or chills; cough; sore throat; pain or trouble passing urine -signs of low calcium like fast heartbeat, muscle cramps or muscle pain; pain, tingling, numbness in the hands or feet; seizures -unusual bleeding or bruising -unusually weak or tired Side effects that usually do not require medical attention (report to your doctor or health care professional if they continue or are bothersome): -constipation -diarrhea -headache -joint pain -loss of appetite -muscle pain -runny nose -tiredness -upset stomach This list may not describe all possible side effects. Call your doctor for medical advice about side effects. You may report side effects to FDA at 1-800-FDA-1088. Where should I keep my medicine? This medicine is only given in a clinic, doctor's office, or other health care setting and will not be stored at home. NOTE: This sheet is a summary. It may not cover all possible information. If you have questions about this medicine, talk to your doctor, pharmacist, or health care provider.  2018 [employer] (2016-02-27 19:17:21)

## 2017-10-28 DIAGNOSIS — Z23 Encounter for immunization: Secondary | ICD-10-CM | POA: Diagnosis not present

## 2017-11-07 DIAGNOSIS — Z9181 History of falling: Secondary | ICD-10-CM | POA: Diagnosis not present

## 2017-11-07 DIAGNOSIS — M5431 Sciatica, right side: Secondary | ICD-10-CM | POA: Diagnosis not present

## 2017-11-07 DIAGNOSIS — I10 Essential (primary) hypertension: Secondary | ICD-10-CM | POA: Diagnosis not present

## 2017-11-18 DIAGNOSIS — H401132 Primary open-angle glaucoma, bilateral, moderate stage: Secondary | ICD-10-CM | POA: Diagnosis not present

## 2017-11-21 DIAGNOSIS — H5711 Ocular pain, right eye: Secondary | ICD-10-CM | POA: Diagnosis not present

## 2017-11-21 DIAGNOSIS — M79674 Pain in right toe(s): Secondary | ICD-10-CM | POA: Diagnosis not present

## 2017-11-21 DIAGNOSIS — M79675 Pain in left toe(s): Secondary | ICD-10-CM | POA: Diagnosis not present

## 2017-12-08 DIAGNOSIS — B351 Tinea unguium: Secondary | ICD-10-CM | POA: Diagnosis not present

## 2017-12-08 DIAGNOSIS — L600 Ingrowing nail: Secondary | ICD-10-CM | POA: Diagnosis not present

## 2017-12-08 DIAGNOSIS — M79675 Pain in left toe(s): Secondary | ICD-10-CM | POA: Diagnosis not present

## 2017-12-08 DIAGNOSIS — M79674 Pain in right toe(s): Secondary | ICD-10-CM | POA: Diagnosis not present

## 2017-12-09 DIAGNOSIS — H409 Unspecified glaucoma: Secondary | ICD-10-CM | POA: Diagnosis not present

## 2017-12-09 DIAGNOSIS — M1610 Unilateral primary osteoarthritis, unspecified hip: Secondary | ICD-10-CM | POA: Diagnosis not present

## 2017-12-09 DIAGNOSIS — M25551 Pain in right hip: Secondary | ICD-10-CM | POA: Diagnosis not present

## 2017-12-09 DIAGNOSIS — M5416 Radiculopathy, lumbar region: Secondary | ICD-10-CM | POA: Diagnosis not present

## 2017-12-09 DIAGNOSIS — I10 Essential (primary) hypertension: Secondary | ICD-10-CM | POA: Diagnosis not present

## 2017-12-09 DIAGNOSIS — Z884 Allergy status to anesthetic agent status: Secondary | ICD-10-CM | POA: Diagnosis not present

## 2017-12-09 DIAGNOSIS — Z885 Allergy status to narcotic agent status: Secondary | ICD-10-CM | POA: Diagnosis not present

## 2017-12-09 DIAGNOSIS — Z888 Allergy status to other drugs, medicaments and biological substances status: Secondary | ICD-10-CM | POA: Diagnosis not present

## 2017-12-09 DIAGNOSIS — M4316 Spondylolisthesis, lumbar region: Secondary | ICD-10-CM | POA: Diagnosis not present

## 2017-12-09 DIAGNOSIS — E039 Hypothyroidism, unspecified: Secondary | ICD-10-CM | POA: Diagnosis not present

## 2017-12-09 DIAGNOSIS — M4726 Other spondylosis with radiculopathy, lumbar region: Secondary | ICD-10-CM | POA: Diagnosis not present

## 2017-12-09 DIAGNOSIS — M1611 Unilateral primary osteoarthritis, right hip: Secondary | ICD-10-CM | POA: Diagnosis not present

## 2017-12-09 DIAGNOSIS — M47816 Spondylosis without myelopathy or radiculopathy, lumbar region: Secondary | ICD-10-CM | POA: Diagnosis not present

## 2017-12-13 DIAGNOSIS — M25551 Pain in right hip: Secondary | ICD-10-CM | POA: Diagnosis not present

## 2017-12-13 DIAGNOSIS — M545 Low back pain: Secondary | ICD-10-CM | POA: Diagnosis not present

## 2017-12-13 DIAGNOSIS — M160 Bilateral primary osteoarthritis of hip: Secondary | ICD-10-CM | POA: Diagnosis not present

## 2017-12-13 DIAGNOSIS — G8929 Other chronic pain: Secondary | ICD-10-CM | POA: Diagnosis not present

## 2017-12-13 DIAGNOSIS — M533 Sacrococcygeal disorders, not elsewhere classified: Secondary | ICD-10-CM | POA: Diagnosis not present

## 2017-12-17 DIAGNOSIS — M25551 Pain in right hip: Secondary | ICD-10-CM | POA: Diagnosis not present

## 2017-12-17 DIAGNOSIS — R69 Illness, unspecified: Secondary | ICD-10-CM | POA: Diagnosis not present

## 2017-12-17 DIAGNOSIS — G301 Alzheimer's disease with late onset: Secondary | ICD-10-CM | POA: Diagnosis not present

## 2017-12-19 DIAGNOSIS — M5417 Radiculopathy, lumbosacral region: Secondary | ICD-10-CM | POA: Diagnosis not present

## 2017-12-19 DIAGNOSIS — E785 Hyperlipidemia, unspecified: Secondary | ICD-10-CM | POA: Diagnosis not present

## 2017-12-19 DIAGNOSIS — M25551 Pain in right hip: Secondary | ICD-10-CM | POA: Diagnosis not present

## 2017-12-19 DIAGNOSIS — M4126 Other idiopathic scoliosis, lumbar region: Secondary | ICD-10-CM | POA: Diagnosis not present

## 2017-12-19 DIAGNOSIS — M4306 Spondylolysis, lumbar region: Secondary | ICD-10-CM | POA: Diagnosis not present

## 2017-12-19 DIAGNOSIS — I10 Essential (primary) hypertension: Secondary | ICD-10-CM | POA: Diagnosis not present

## 2017-12-19 DIAGNOSIS — G301 Alzheimer's disease with late onset: Secondary | ICD-10-CM | POA: Diagnosis not present

## 2017-12-19 DIAGNOSIS — M160 Bilateral primary osteoarthritis of hip: Secondary | ICD-10-CM | POA: Diagnosis not present

## 2017-12-19 DIAGNOSIS — R69 Illness, unspecified: Secondary | ICD-10-CM | POA: Diagnosis not present

## 2017-12-23 DIAGNOSIS — H401122 Primary open-angle glaucoma, left eye, moderate stage: Secondary | ICD-10-CM | POA: Diagnosis not present

## 2017-12-29 DIAGNOSIS — Z08 Encounter for follow-up examination after completed treatment for malignant neoplasm: Secondary | ICD-10-CM | POA: Diagnosis not present

## 2017-12-29 DIAGNOSIS — L570 Actinic keratosis: Secondary | ICD-10-CM | POA: Diagnosis not present

## 2017-12-29 DIAGNOSIS — D485 Neoplasm of uncertain behavior of skin: Secondary | ICD-10-CM | POA: Diagnosis not present

## 2017-12-29 DIAGNOSIS — L821 Other seborrheic keratosis: Secondary | ICD-10-CM | POA: Diagnosis not present

## 2017-12-29 DIAGNOSIS — D225 Melanocytic nevi of trunk: Secondary | ICD-10-CM | POA: Diagnosis not present

## 2017-12-29 DIAGNOSIS — Z85828 Personal history of other malignant neoplasm of skin: Secondary | ICD-10-CM | POA: Diagnosis not present

## 2017-12-29 DIAGNOSIS — C44519 Basal cell carcinoma of skin of other part of trunk: Secondary | ICD-10-CM | POA: Diagnosis not present

## 2018-02-02 DIAGNOSIS — C44519 Basal cell carcinoma of skin of other part of trunk: Secondary | ICD-10-CM | POA: Diagnosis not present

## 2018-04-20 ENCOUNTER — Ambulatory Visit (HOSPITAL_COMMUNITY): Payer: Medicare HMO

## 2018-10-27 IMAGING — MR MR HEAD W/O CM
8 of 10 series · 39 of 48 positions shown · non-contrast
Comparison: Head CT 05/02/2004

CLINICAL DATA: Memory loss for 1.5 years.

EXAM:
MRI HEAD WITHOUT CONTRAST
TECHNIQUE: Multiplanar, multiecho pulse sequences of the brain and surrounding
structures were obtained without intravenous contrast.

[Series 3: T1 · sagittal · 5.0mm · 0.47mm/px · 1 of 23 slices shown]
[im 1/23]
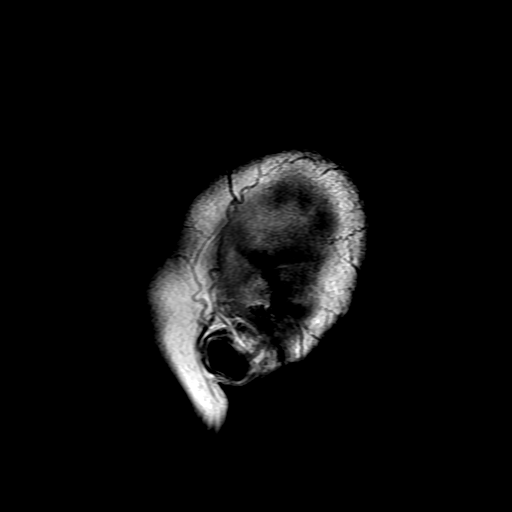

[Series 4: DWI · axial · 3.0mm · 1.09mm/px · z∈[-103,+33]mm · 11 of 100 slices shown (1 of 4)]
[im 1/100]
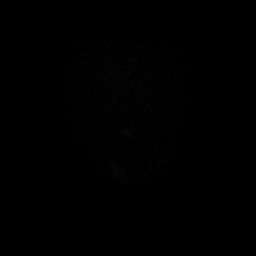
[im 10/100]
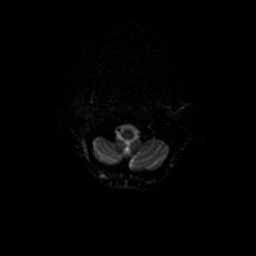
[im 20/100]
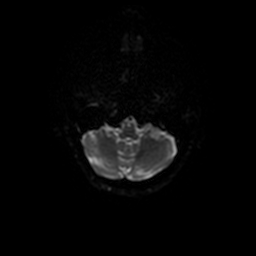
[im 30/100]
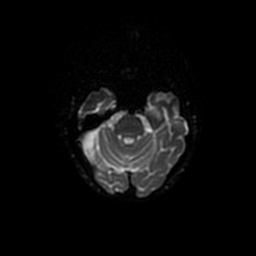
[im 40/100]
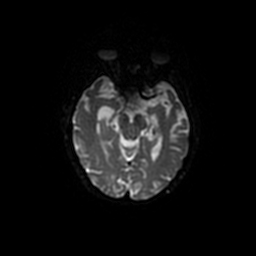
[im 50/100]
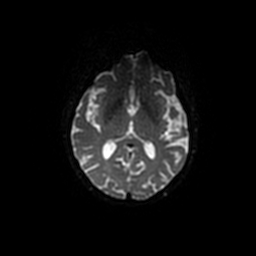
[im 60/100]
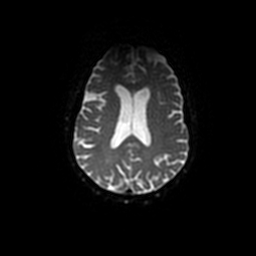
[im 70/100]
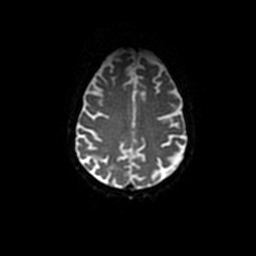
[im 80/100]
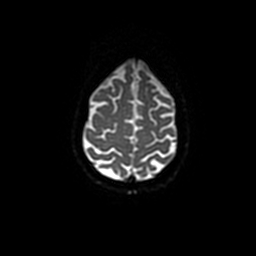
[im 90/100]
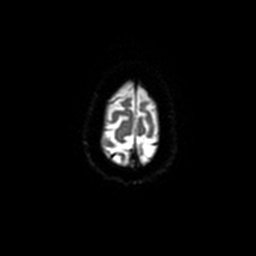
[im 100/100]
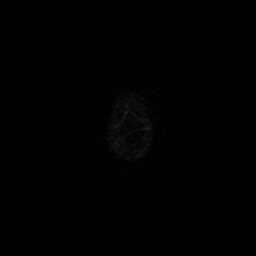

[Series 5: DWI · coronal · 5.0mm · 1.09mm/px · 9 of 80 slices shown (2 of 4)]
[im 1/80]
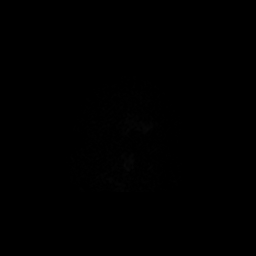
[im 10/80]
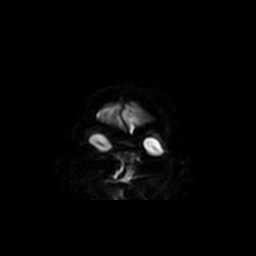
[im 20/80]
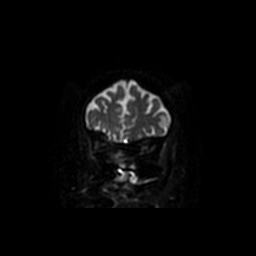
[im 30/80]
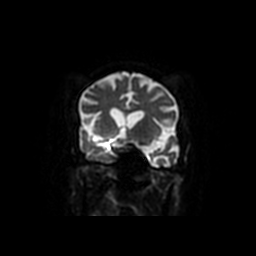
[im 40/80]
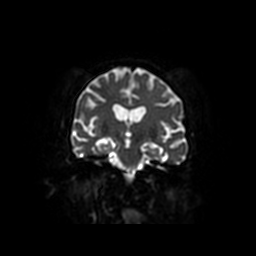
[im 50/80]
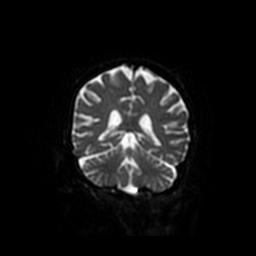
[im 60/80]
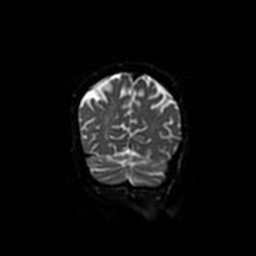
[im 70/80]
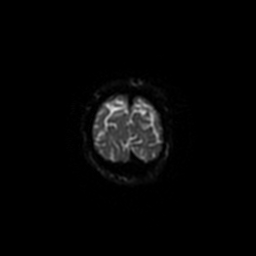
[im 80/80]
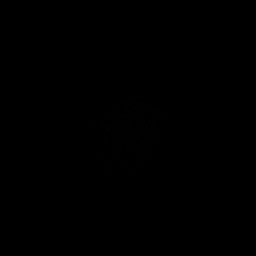

[Series 6: T2 · axial · 5.0mm · 0.43mm/px · z∈[-107,+33]mm · 3 of 25 slices shown]
[im 1/25]
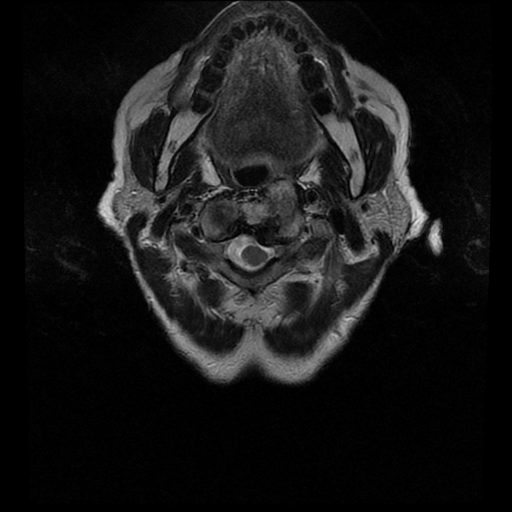
[im 13/25]
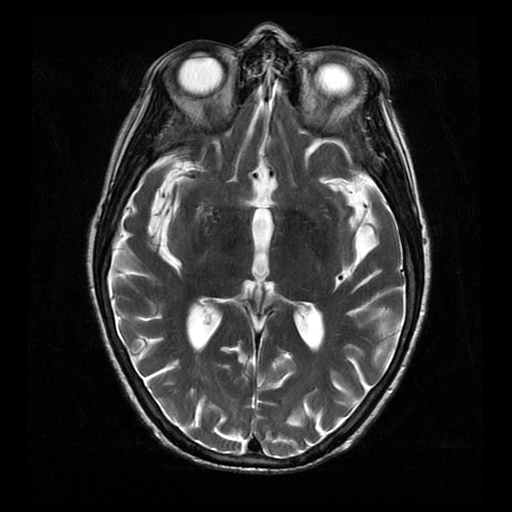
[im 25/25]
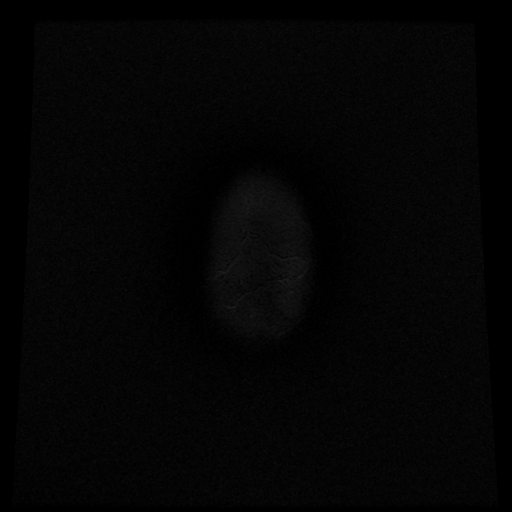

[Series 7: FLAIR · axial · 5.0mm · 0.43mm/px · z∈[-113,+39]mm · 3 of 25 slices shown]
[im 1/25]
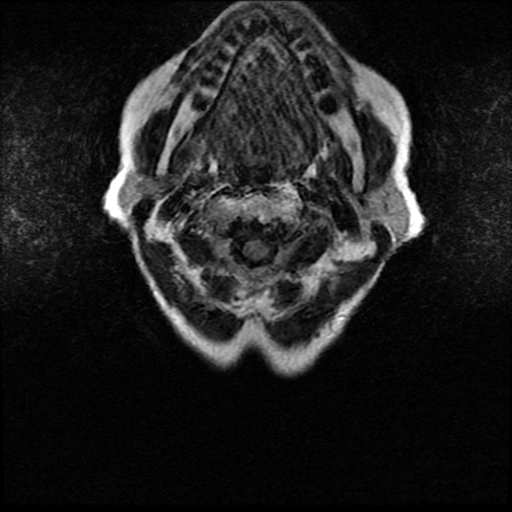
[im 13/25]
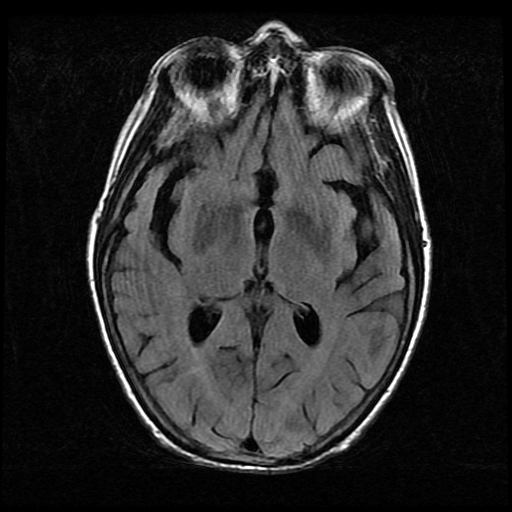
[im 25/25]
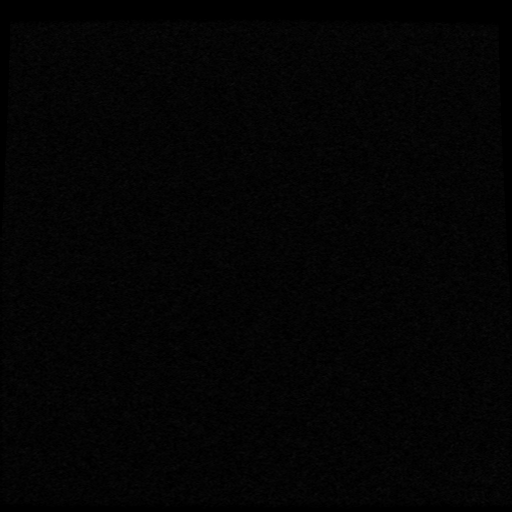

[Series 10: T2 post-contrast · coronal · 5.0mm · 0.45mm/px · 3 of 30 slices shown]
[im 1/30]
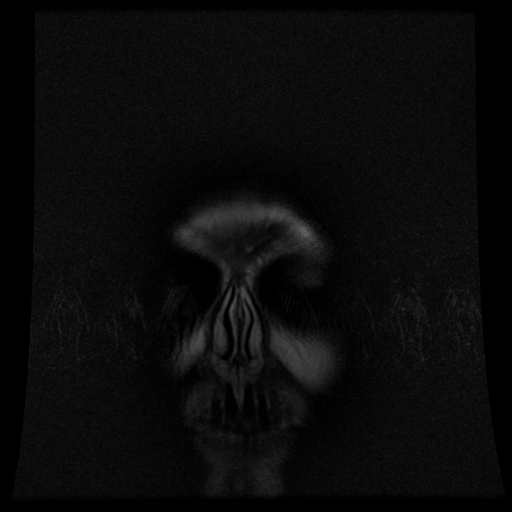
[im 15/30]
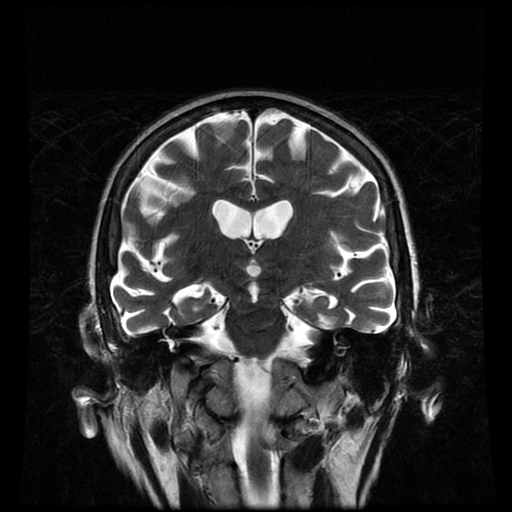
[im 30/30]
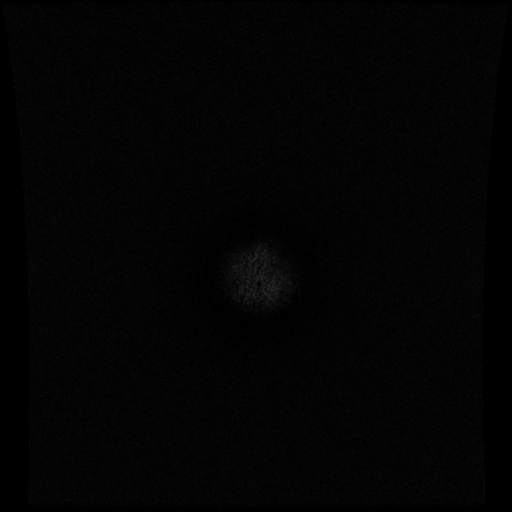

[Series 400: DWI · axial · 3.0mm · 1.09mm/px · z∈[-103,+33]mm · 5 of 50 slices shown (3 of 4)]
[im 1/50]
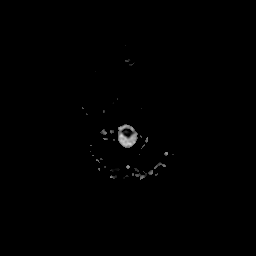
[im 13/50]
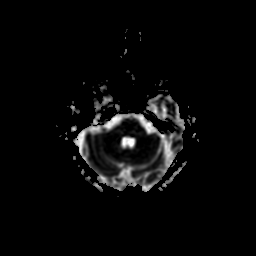
[im 25/50]
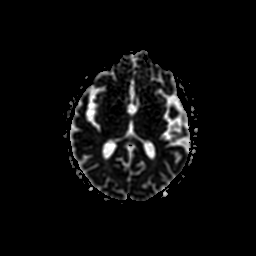
[im 37/50]
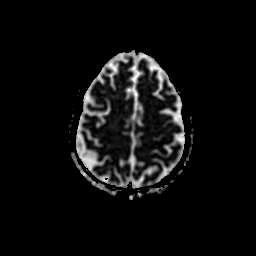
[im 50/50]
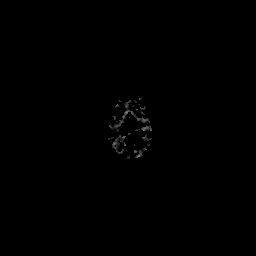

[Series 500: DWI · coronal · 5.0mm · 1.09mm/px · 4 of 39 slices shown (4 of 4)]
[im 1/39]
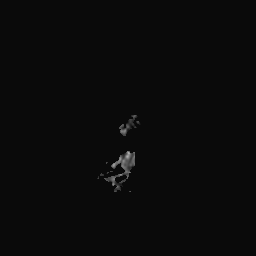
[im 13/39]
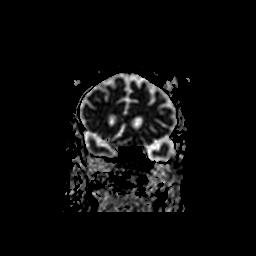
[im 26/39]
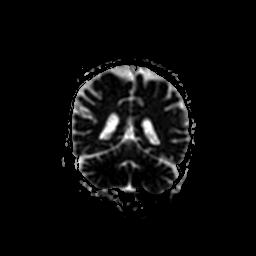
[im 39/39]
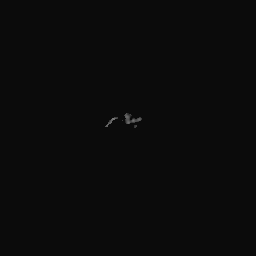

[39 of 48 positions shown; findings below may reference images not displayed]

FINDINGS: Brain: No reversible cause of memory loss including hydrocephalus,
extra-axial collection, or mass. No unexpected ischemic changes for
age. There is at least moderate mesial temporal volume loss, out of
proportion to otherwise age congruent volume loss. No cortical or
thalamic diffusion abnormality.

Vascular: Preserved flow voids

Skull and upper cervical spine: Advanced degenerative change in the
visualized cervical spine with bulky spurring ventrally from the
left C1-2 reticulation.

Sinuses/Orbits: Negative
IMPRESSION: 1. At least moderate mesial temporal atrophy has developed since
0449, favoring Alzheimer's disease in this setting.
2. No acute or reversible finding.
